# Patient Record
Sex: Female | Born: 1940 | Race: White | Hispanic: No | Smoking: Never smoker
Health system: Southern US, Community
[De-identification: ages and names within clinical notes are randomized; demographics above are authoritative.]

## PROBLEM LIST (undated history)

## (undated) DIAGNOSIS — I441 Atrioventricular block, second degree: Secondary | ICD-10-CM

## (undated) DIAGNOSIS — E785 Hyperlipidemia, unspecified: Secondary | ICD-10-CM

## (undated) DIAGNOSIS — C801 Malignant (primary) neoplasm, unspecified: Secondary | ICD-10-CM

## (undated) DIAGNOSIS — C73 Malignant neoplasm of thyroid gland: Secondary | ICD-10-CM

## (undated) DIAGNOSIS — I10 Essential (primary) hypertension: Secondary | ICD-10-CM

## (undated) DIAGNOSIS — M5134 Other intervertebral disc degeneration, thoracic region: Secondary | ICD-10-CM

## (undated) DIAGNOSIS — N200 Calculus of kidney: Secondary | ICD-10-CM

## (undated) HISTORY — PX: JOINT REPLACEMENT: SHX530

## (undated) HISTORY — PX: CHOLECYSTECTOMY: SHX55

## (undated) HISTORY — PX: ABDOMINAL HYSTERECTOMY: SHX81

## (undated) HISTORY — PX: THYROID SURGERY: SHX805

---

## 2016-01-25 ENCOUNTER — Inpatient Hospital Stay (HOSPITAL_COMMUNITY)
Admission: EM | Admit: 2016-01-25 | Discharge: 2016-01-29 | DRG: 244 | Disposition: A | Discharge status: Home / Self Care | Payer: Medicare PPO | Attending: Cardiovascular Disease | Admitting: Cardiovascular Disease

## 2016-01-25 ENCOUNTER — Encounter (HOSPITAL_COMMUNITY): Payer: Self-pay

## 2016-01-25 DIAGNOSIS — R072 Precordial pain: Secondary | ICD-10-CM | POA: Diagnosis not present

## 2016-01-25 DIAGNOSIS — M549 Dorsalgia, unspecified: Secondary | ICD-10-CM | POA: Diagnosis present

## 2016-01-25 DIAGNOSIS — R079 Chest pain, unspecified: Secondary | ICD-10-CM | POA: Diagnosis not present

## 2016-01-25 DIAGNOSIS — R001 Bradycardia, unspecified: Secondary | ICD-10-CM | POA: Diagnosis not present

## 2016-01-25 DIAGNOSIS — Z79890 Hormone replacement therapy (postmenopausal): Secondary | ICD-10-CM

## 2016-01-25 DIAGNOSIS — I442 Atrioventricular block, complete: Principal | ICD-10-CM | POA: Diagnosis present

## 2016-01-25 DIAGNOSIS — G8929 Other chronic pain: Secondary | ICD-10-CM | POA: Diagnosis present

## 2016-01-25 DIAGNOSIS — E785 Hyperlipidemia, unspecified: Secondary | ICD-10-CM | POA: Diagnosis present

## 2016-01-25 DIAGNOSIS — Z85850 Personal history of malignant neoplasm of thyroid: Secondary | ICD-10-CM

## 2016-01-25 DIAGNOSIS — Z79899 Other long term (current) drug therapy: Secondary | ICD-10-CM | POA: Diagnosis not present

## 2016-01-25 DIAGNOSIS — Z886 Allergy status to analgesic agent status: Secondary | ICD-10-CM | POA: Diagnosis not present

## 2016-01-25 DIAGNOSIS — I10 Essential (primary) hypertension: Secondary | ICD-10-CM | POA: Diagnosis present

## 2016-01-25 DIAGNOSIS — E039 Hypothyroidism, unspecified: Secondary | ICD-10-CM | POA: Diagnosis present

## 2016-01-25 DIAGNOSIS — Z959 Presence of cardiac and vascular implant and graft, unspecified: Secondary | ICD-10-CM

## 2016-01-25 DIAGNOSIS — Z882 Allergy status to sulfonamides status: Secondary | ICD-10-CM | POA: Diagnosis not present

## 2016-01-25 DIAGNOSIS — E890 Postprocedural hypothyroidism: Secondary | ICD-10-CM | POA: Diagnosis present

## 2016-01-25 DIAGNOSIS — M1990 Unspecified osteoarthritis, unspecified site: Secondary | ICD-10-CM | POA: Diagnosis present

## 2016-01-25 DIAGNOSIS — Z880 Allergy status to penicillin: Secondary | ICD-10-CM | POA: Diagnosis not present

## 2016-01-25 DIAGNOSIS — R42 Dizziness and giddiness: Secondary | ICD-10-CM | POA: Diagnosis present

## 2016-01-25 DIAGNOSIS — I441 Atrioventricular block, second degree: Secondary | ICD-10-CM | POA: Diagnosis not present

## 2016-01-25 HISTORY — DX: Hyperlipidemia, unspecified: E78.5

## 2016-01-25 HISTORY — DX: Other intervertebral disc degeneration, thoracic region: M51.34

## 2016-01-25 HISTORY — DX: Essential (primary) hypertension: I10

## 2016-01-25 HISTORY — DX: Malignant neoplasm of thyroid gland: C73

## 2016-01-25 HISTORY — DX: Atrioventricular block, second degree: I44.1

## 2016-01-25 HISTORY — DX: Calculus of kidney: N20.0

## 2016-01-25 LAB — COMPREHENSIVE METABOLIC PANEL
ALBUMIN: 3.8 g/dL (ref 3.5–5.0)
ALT: 16 U/L (ref 14–54)
AST: 24 U/L (ref 15–41)
Alkaline Phosphatase: 41 U/L (ref 38–126)
Anion gap: 12 (ref 5–15)
BUN: 14 mg/dL (ref 6–20)
CHLORIDE: 105 mmol/L (ref 101–111)
CO2: 28 mmol/L (ref 22–32)
CREATININE: 0.96 mg/dL (ref 0.44–1.00)
Calcium: 9.4 mg/dL (ref 8.9–10.3)
GFR calc Af Amer: >60 mL/min (ref >60)
GFR calc non Af Amer: 57 mL/min — ABNORMAL LOW (ref >60)
GLUCOSE: 119 mg/dL — AB (ref 65–99)
POTASSIUM: 4.7 mmol/L (ref 3.5–5.1)
Sodium: 145 mmol/L (ref 135–145)
Total Bilirubin: 0.6 mg/dL (ref 0.3–1.2)
Total Protein: 6.4 g/dL — ABNORMAL LOW (ref 6.5–8.1)

## 2016-01-25 LAB — CBC
HCT: 42.7 % (ref 36.0–46.0)
Hemoglobin: 13.9 g/dL (ref 12.0–15.0)
MCH: 30.2 pg (ref 26.0–34.0)
MCHC: 32.6 g/dL (ref 30.0–36.0)
MCV: 92.8 fL (ref 78.0–100.0)
PLATELETS: 231 10*3/uL (ref 150–400)
RBC: 4.6 MIL/uL (ref 3.87–5.11)
RDW: 14.6 % (ref 11.5–15.5)
WBC: 6.8 10*3/uL (ref 4.0–10.5)

## 2016-01-25 LAB — I-STAT TROPONIN, ED: Troponin i, poc: 0 ng/mL (ref 0.00–0.08)

## 2016-01-25 LAB — TROPONIN I
TROPONIN I: 0.04 ng/mL — AB (ref <0.031)
TROPONIN I: 0.04 ng/mL — AB (ref <0.031)
Troponin I: 0.04 ng/mL — ABNORMAL HIGH (ref <0.031)

## 2016-01-25 LAB — MAGNESIUM
MAGNESIUM: 1.9 mg/dL (ref 1.7–2.4)
Magnesium: 1.8 mg/dL (ref 1.7–2.4)

## 2016-01-25 LAB — PROTIME-INR
INR: 1.07 (ref 0.00–1.49)
Prothrombin Time: 14.1 seconds (ref 11.6–15.2)

## 2016-01-25 LAB — APTT: APTT: 35 s (ref 24–37)

## 2016-01-25 LAB — TSH
TSH: 1.984 u[IU]/mL (ref 0.350–4.500)
TSH: 1.434 u[IU]/mL (ref 0.350–4.500)

## 2016-01-25 MED ORDER — ALBUTEROL SULFATE (2.5 MG/3ML) 0.083% IN NEBU: 2.5000 mg | INHALATION_SOLUTION | Freq: Four times a day (QID) | RESPIRATORY_TRACT | Status: DC | PRN

## 2016-01-25 MED ORDER — SODIUM CHLORIDE 0.9 % IV BOLUS (SEPSIS)
1000.0000 mL | Freq: Once | INTRAVENOUS | Status: AC
  Administered 2016-01-25: 1000 mL via INTRAVENOUS

## 2016-01-25 MED ORDER — ACETAMINOPHEN 325 MG PO TABS
650.0000 mg | ORAL_TABLET | Freq: Four times a day (QID) | ORAL | Status: DC | PRN
  Administered 2016-01-26: 650 mg via ORAL
  Filled 2016-01-25: qty 2

## 2016-01-25 MED ORDER — REGADENOSON 0.4 MG/5ML IV SOLN
0.4000 mg | Freq: Once | INTRAVENOUS | Status: AC
  Administered 2016-01-26: 0.4 mg via INTRAVENOUS
  Filled 2016-01-25: qty 5

## 2016-01-25 MED ORDER — ALBUTEROL SULFATE HFA 108 (90 BASE) MCG/ACT IN AERS: 1.0000 | INHALATION_SPRAY | Freq: Four times a day (QID) | RESPIRATORY_TRACT | Status: DC | PRN

## 2016-01-25 MED ORDER — PRAVASTATIN SODIUM 40 MG PO TABS
80.0000 mg | ORAL_TABLET | Freq: Every day | ORAL | Status: DC
  Administered 2016-01-25 – 2016-01-29 (×4): 80 mg via ORAL
  Filled 2016-01-25 (×4): qty 2

## 2016-01-25 MED ORDER — LEVOTHYROXINE SODIUM 88 MCG PO TABS
88.0000 ug | ORAL_TABLET | Freq: Every day | ORAL | Status: DC
  Administered 2016-01-27 – 2016-01-29 (×3): 88 ug via ORAL
  Filled 2016-01-25 (×3): qty 1

## 2016-01-25 MED ORDER — SODIUM CHLORIDE 0.9 % IV SOLN
INTRAVENOUS | Status: DC
  Administered 2016-01-25: 1000 mL via INTRAVENOUS

## 2016-01-25 MED ORDER — GABAPENTIN 300 MG PO CAPS
300.0000 mg | ORAL_CAPSULE | Freq: Three times a day (TID) | ORAL | Status: DC
  Administered 2016-01-25 – 2016-01-29 (×11): 300 mg via ORAL
  Filled 2016-01-25 (×11): qty 1

## 2016-01-25 MED ORDER — LISINOPRIL 10 MG PO TABS
10.0000 mg | ORAL_TABLET | Freq: Every day | ORAL | Status: DC
  Administered 2016-01-25 – 2016-01-29 (×4): 10 mg via ORAL
  Filled 2016-01-25 (×4): qty 1

## 2016-01-25 MED ORDER — ALLOPURINOL 100 MG PO TABS
100.0000 mg | ORAL_TABLET | Freq: Two times a day (BID) | ORAL | Status: DC
  Administered 2016-01-25 – 2016-01-29 (×7): 100 mg via ORAL
  Filled 2016-01-25 (×7): qty 1

## 2016-01-25 NOTE — ED Notes (Signed)
Attempted to call report

## 2016-01-25 NOTE — Plan of Care (Signed)
Problem: Safety: Goal: Ability to remain free from injury will improve Outcome: Completed/Met Date Met:  01/25/16 Pt educated on safety measures put into place. Pt verbalized understanding. Call bell within reach and bed alarm on.

## 2016-01-25 NOTE — ED Notes (Signed)
MD at bedside. 

## 2016-01-25 NOTE — ED Notes (Signed)
Pt request po fluids . Cardiology pa aware and states no until seen by Cardiology MD.

## 2016-01-25 NOTE — ED Notes (Addendum)
Pt arrives EMS from surgical center. Pt was there for back surgery, had been sedated and intubated and on rolling over became bradicardic at 28. No CPR initiated. Pt not intubated on arrival. Pt c/o pain at top jaw.

## 2016-01-25 NOTE — ED Notes (Signed)
Pt up t bedpan .

## 2016-01-25 NOTE — ED Notes (Signed)
Pt tasking po fluids and tolerating well.

## 2016-01-25 NOTE — ED Provider Notes (Signed)
CSN: [id number]     Arrival date & time 01/25/16  [id number] History   First MD Initiated Contact with Patient 01/25/16 0940     Chief Complaint  Patient presents with  . Bradycardia     (Consider location/radiation/quality/duration/timing/severity/associated sxs/prior Treatment) HPI   75 year old female without any significant past medical history presents to the emergency department from the spinal surgery center. She underwent general anesthesia and when he went to roll the patient to a prone position she had  An event where she became bradycardic to the 20s and her blood pressure dropped to 0000000 systolic. She was given some ephedrine , woke up from anesthesia and sent here for further evaluation. On EMS arrival patient still had a heart rate in the 40s but blood pressure had improved [id number] systolic. Review of history with the patient and she still groggy does not relate any history of the same in the right now is only complaining of jaw pain. Her son is with her and also does not recall any events similar to this. She has had previous surgeries before without any complications. At this time no chest pain, shortness of breath, lightheadedness or dizziness. Feel like her grogginess is likely secondary to being postanesthesia.  Past Medical History  Diagnosis Date  . Kidney stones    Past Surgical History  Procedure Laterality Date  . Joint replacement    . Cholecystectomy    . Abdominal hysterectomy    . Thyroid surgery     Family History  Problem Relation Age of Onset  . Hypertension Mother    Social History  Substance Use Topics  . Smoking status: Never Smoker   . Smokeless tobacco: None  . Alcohol Use: No   OB History    No data available     Review of Systems  Unable to perform ROS: Other      Allergies  Dilaudid; Penicillins; Sulfa antibiotics; and Tizanidine hcl  Home Medications   Prior to Admission medications   Medication Sig Start Date End Date Taking?  Authorizing Provider  albuterol (PROVENTIL HFA;VENTOLIN HFA) 108 (90 Base) MCG/ACT inhaler Inhale 1 puff into the lungs every 6 (six) hours as needed for wheezing or shortness of breath.   Yes Historical Provider, MD  allopurinol (ZYLOPRIM) 100 MG tablet Take 100 mg by mouth 2 (two) times daily.   Yes Historical Provider, MD  ALPRAZolam Duanne Moron) 1 MG tablet Take 1 mg by mouth at bedtime as needed for anxiety.   Yes Historical Provider, MD  gabapentin (NEURONTIN) 300 MG capsule Take 300 mg by mouth 3 (three) times daily.   Yes Historical Provider, MD  HYDROcodone-acetaminophen (NORCO) 7.5-325 MG tablet Take 1 tablet by mouth every 6 (six) hours as needed for moderate pain.   Yes Historical Provider, MD  levothyroxine (SYNTHROID, LEVOTHROID) 88 MCG tablet Take 88 mcg by mouth daily before breakfast.   Yes Historical Provider, MD  lisinopril (PRINIVIL,ZESTRIL) 10 MG tablet Take 10 mg by mouth daily.   Yes Historical Provider, MD  pravastatin (PRAVACHOL) 80 MG tablet Take 80 mg by mouth daily.   Yes Historical Provider, MD   BP 132/66 mmHg  Pulse 46  Temp(Src) 98 F (36.7 C) (Oral)  Resp 17  Ht 5\' 4"  (1.626 m)  Wt 190 lb 1.6 oz (86.229 kg)  BMI 32.61 kg/m2  SpO2 100% Physical Exam  Constitutional: She appears well-developed and well-nourished.  HENT:  Head: Normocephalic and atraumatic.  Neck: Normal range of motion.  Cardiovascular: Normal rate  and regular rhythm.   Pulmonary/Chest: Effort normal and breath sounds normal. No stridor. No respiratory distress. She has no wheezes.  Abdominal: Soft. She exhibits no distension.  Musculoskeletal: Normal range of motion. She exhibits no edema.  Neurological: She is alert.  Skin: Skin is warm and dry.  Nursing note and vitals reviewed.   ED Course  Procedures (including critical care time) Labs Review Labs Reviewed  COMPREHENSIVE METABOLIC PANEL - Abnormal; Notable for the following:    Glucose, Bld 119 (*)    Total Protein 6.4 (*)    GFR  calc non Af Amer 57 (*)    All other components within normal limits  TROPONIN I - Abnormal; Notable for the following:    Troponin I 0.04 (*)    All other components within normal limits  BASIC METABOLIC PANEL - Abnormal; Notable for the following:    Glucose, Bld 114 (*)    Calcium 8.7 (*)    GFR calc non Af Amer 58 (*)    All other components within normal limits  TROPONIN I - Abnormal; Notable for the following:    Troponin I 0.04 (*)    All other components within normal limits  TROPONIN I - Abnormal; Notable for the following:    Troponin I 0.04 (*)    All other components within normal limits  TROPONIN I - Abnormal; Notable for the following:    Troponin I 0.05 (*)    All other components within normal limits  APTT  CBC  PROTIME-INR  MAGNESIUM  TSH  TSH  MAGNESIUM  CBC  I-STAT TROPOININ, ED    Imaging Review No results found. I have personally reviewed and evaluated these images and lab results as part of my medical decision-making.   EKG Interpretation   Date/Time:  Friday January 25 2016 14:01:07 EST Ventricular Rate:  61 PR Interval:  56 QRS Duration: 131 QT Interval:  465 QTC Calculation: 468 R Axis:   -10 Text Interpretation:  Sinus rhythm Supraventricular bigeminy Sinus pause  Short PR interval Right bundle branch block ED PHYSICIAN INTERPRETATION  AVAILABLE IN [facility] Confirmed by TEST, Record ([id number]) on 01/26/2016  8:48:12 AM      MDM   Final diagnoses:  Chest pain    75 year old female with  An episode of hypotension and bradycardia on her general anesthesia. Here with heart rates as low as the 40s but improving mental status and overall asymptomatic.  TSH was normal. Rest of her labs were also normal. Her EKG does appear to have a complete heart block so discussed the case cardiology will admit for further observation. She had a minimally elevated troponin think this is likely secondary to demand ischemia from the hypotension episode  however cardiology will continue to trend these.     Merrily Pew, MD 01/26/16 [id number]

## 2016-01-25 NOTE — H&P (Signed)
Patient ID: Sharon Pineda MRN: [id number], DOB/AGE: [birth year]   Admit date: 01/25/2016   Primary Physician: Majel Homer, [place] Primary Cardiologist: [facility] (Dr. Oval Linsey)  Pt. Profile:  75 y/o female with h/o cardiac murmur, HTN, HLD, hypothyroidism and chronic back pain who presents to ED from outpatient surgical center for severe bradycardia after receiving anesthesia for planned lumbar surgery.  Problem List  Past Medical History  Diagnosis Date  . Kidney stones     Past Surgical History  Procedure Laterality Date  . Joint replacement    . Cholecystectomy    . Abdominal hysterectomy    . Thyroid surgery       Allergies  Allergies  Allergen Reactions  . Dilaudid [Hydromorphone Hcl] Anaphylaxis  . Penicillins Swelling    HPI  75 y/o female presenting to the ED for severe bradycardia, after receiving anesthesia at the outpatient surgical clearance for planned lumbar surgery. Apparently, she was getting prepped for surgery. She was intubated and under anesthesia. She was rolled onto her side on the operating table and braided down to 28 bpm. She did not require CPR.  It is not documented whether or not she required any atropine. The surgery was aborted and she was transferred to [facility]. She is currently in the 60s on telemetry but occasionally dips into the 40s and 50s. She is currently resting and comfortable. BP is stable. There are several EKG strips that look concerning with PAF.   The patient notes a h/o HTN and HLD, treated with lisinopril and pravastatin. Also with h/o thyroid cancer and underwent thyroidectomy in the 1970s resulting in hypothyroidism. She is on chronic hormone replacement with  levothyroxine. She denies any prior cardiac history, other than being told that she has a murmur. She reports that she checks her BP and HR at home regularly, and it is not rare for her HR to run in the 40s. She notes frequent dizziness. Also with h/o near syncope. She reports that  she was seen in an ED in 10/2015 for near syncope, but there is no record of this. She denies CP. She has occasional dyspnea with exertion but no resting dyspnea. She also occasionally feels irregular palpitations. She notes that her TSH has not been checked recently. She is not on any AVN blocking agents. K is WNL at 4.7. Troponin is abnormal at 0.04. Renal function is normal. She currently notes bilateral jaw pain but this may be from recent intubation.      Home Medications  Prior to Admission medications   Medication Sig Start Date End Date Taking? Authorizing Provider  albuterol (PROVENTIL HFA;VENTOLIN HFA) 108 (90 Base) MCG/ACT inhaler Inhale 1 puff into the lungs every 6 (six) hours as needed for wheezing or shortness of breath.   Yes Historical Provider, MD  allopurinol (ZYLOPRIM) 100 MG tablet Take 100 mg by mouth 2 (two) times daily.   Yes Historical Provider, MD  ALPRAZolam Duanne Moron) 1 MG tablet Take 1 mg by mouth at bedtime as needed for anxiety.   Yes Historical Provider, MD  gabapentin (NEURONTIN) 300 MG capsule Take 300 mg by mouth 3 (three) times daily.   Yes Historical Provider, MD  HYDROcodone-acetaminophen (NORCO) 7.5-325 MG tablet Take 1 tablet by mouth every 6 (six) hours as needed for moderate pain.   Yes Historical Provider, MD  levothyroxine (SYNTHROID, LEVOTHROID) 88 MCG tablet Take 88 mcg by mouth daily before breakfast.   Yes Historical Provider, MD  lisinopril (PRINIVIL,ZESTRIL) 10 MG tablet Take 10 mg  by mouth daily.   Yes Historical Provider, MD  pravastatin (PRAVACHOL) 80 MG tablet Take 80 mg by mouth daily.   Yes Historical Provider, MD    Family History  HTN - mother  Social History  Social History   Social History  . Marital Status: Unknown    Spouse Name: N/A  . Number of Children: N/A  . Years of Education: N/A   Occupational History  . Not on file.   Social History Main Topics  . Smoking status: Never Smoker   . Smokeless tobacco: Not on file    . Alcohol Use: No  . Drug Use: No  . Sexual Activity: Not on file   Other Topics Concern  . Not on file   Social History Narrative  . No narrative on file     Review of Systems General:  No chills, fever, night sweats or weight changes.  Cardiovascular:  No chest pain, dyspnea on exertion, edema, orthopnea, palpitations, paroxysmal nocturnal dyspnea. Dermatological: No rash, lesions/masses Respiratory: No cough, dyspnea Urologic: No hematuria, dysuria Abdominal:   No nausea, vomiting, diarrhea, bright red blood per rectum, melena, or hematemesis Neurologic:  No visual changes, wkns, changes in mental status. All other systems reviewed and are otherwise negative except as noted above.  Physical Exam  Blood pressure 147/59, pulse 69, temperature 97.8 F (36.6 C), temperature source Oral, resp. rate 19, height 5\' 4"  (1.626 m), weight 186 lb (84.369 kg), SpO2 100 %.  General: Pleasant, NAD Psych: Normal affect. Neuro: Alert and oriented X 3. Moves all extremities spontaneously. HEENT: Normal  Neck: Supple without bruits or JVD. Lungs:  Resp regular and unlabored, CTA. Heart: RRR no s3, s4, faint 1/6 SM along LUSB Abdomen: Soft, non-tender, non-distended, BS + x 4.  Extremities: No clubbing, cyanosis or edema. DP/PT/Radials 2+ and equal bilaterally.  Labs  Troponin [facility] Test)  Recent Labs  01/25/16 1021  TROPIPOC 0.00    Recent Labs  01/25/16 0950  TROPONINI 0.04*   Lab Results  Component Value Date   WBC 6.8 01/25/2016   HGB 13.9 01/25/2016   HCT 42.7 01/25/2016   MCV 92.8 01/25/2016   PLT 231 01/25/2016    Recent Labs Lab 01/25/16 0950  NA 145  K 4.7  CL 105  CO2 28  BUN 14  CREATININE 0.96  CALCIUM 9.4  PROT 6.4*  BILITOT 0.6  ALKPHOS 41  ALT 16  AST 24  GLUCOSE 119*   No results found for: CHOL, HDL, LDLCALC, TRIG No results found for: DDIMER   Radiology/Studies  No results found.  ECG  Several EKGs obtained. Some show  NSR, sinus brady and ? PAF  ASSESSMENT AND PLAN  1. Bradycardia: patient reports frequent h/o low pulse rates, occasionally in the 40s at home. She became severely bradycardiac after receiving anesthesia. She is now stable in the ED however her heart rate continues to fluctuate occasionally dipping down in the 40s and 50s. She is  currently normal sinus rhythm in the low 60s. K is stable at 4.7. Hemoglobin and renal function are  both normal. Initial troponin is abnormal at 0.04. She does not appear to be on any AV nodal blocking agents. Suspect she has baseline bradycardia that was exacerbated by the anesthesia. However we will admit for overnight observation for continued telemetry monitoring. Given her known hypothyroidism and fact that her TSH has not been recently checked, this will need to be assessed to ensure that she is receiving enough  replacement with levothyroxine. We will also need to continue to cycle cardiac enzymes. Initial troponin is 0.04. Will cycle 3. She reports a history of cardiac murmur which is also present on exam today at the left upper sternal border. Will obtain a 2-D echo to assess for structural abnormalities. We will also order a Lexiscan NST to r/o ischemia ?RCA ischemia. If NST is normal, we will ask EP to see for possible PPM.    Signed, SIMMONS, BRITTAINY, PA-C 01/25/2016, 11:28 AM

## 2016-01-25 NOTE — Progress Notes (Signed)
Pt's resting in bed. Pt denies any CP or SOB. HR sustaining in the 40's and will dip down into the 30's non sustaining. EKG obtained. Dr. Susy Manor made aware and reviewed saved tele strips and EKG. Will cont to monitor pt.

## 2016-01-25 NOTE — ED Notes (Signed)
Pt logrolled and sheets changed with skin care.

## 2016-01-26 ENCOUNTER — Inpatient Hospital Stay (HOSPITAL_COMMUNITY): Payer: Medicare PPO

## 2016-01-26 DIAGNOSIS — R079 Chest pain, unspecified: Secondary | ICD-10-CM

## 2016-01-26 LAB — CBC
HEMATOCRIT: 37.6 % (ref 36.0–46.0)
Hemoglobin: 12 g/dL (ref 12.0–15.0)
MCH: 29.9 pg (ref 26.0–34.0)
MCHC: 31.9 g/dL (ref 30.0–36.0)
MCV: 93.5 fL (ref 78.0–100.0)
Platelets: 202 10*3/uL (ref 150–400)
RBC: 4.02 MIL/uL (ref 3.87–5.11)
RDW: 14.6 % (ref 11.5–15.5)
WBC: 5.5 10*3/uL (ref 4.0–10.5)

## 2016-01-26 LAB — NM MYOCAR MULTI W/SPECT W/WALL MOTION / EF
CSEPEDS: 4 s
Estimated workload: 1 METS
Exercise duration (min): 8 min
Peak HR: 98 {beats}/min
Rest HR: 46 {beats}/min

## 2016-01-26 LAB — TROPONIN I: Troponin I: 0.05 ng/mL — ABNORMAL HIGH (ref <0.031)

## 2016-01-26 LAB — BASIC METABOLIC PANEL
ANION GAP: 7 (ref 5–15)
BUN: 9 mg/dL (ref 6–20)
CALCIUM: 8.7 mg/dL — AB (ref 8.9–10.3)
CO2: 30 mmol/L (ref 22–32)
CREATININE: 0.95 mg/dL (ref 0.44–1.00)
Chloride: 108 mmol/L (ref 101–111)
GFR calc Af Amer: >60 mL/min (ref >60)
GFR calc non Af Amer: 58 mL/min — ABNORMAL LOW (ref >60)
GLUCOSE: 114 mg/dL — AB (ref 65–99)
Potassium: 4.2 mmol/L (ref 3.5–5.1)
Sodium: 145 mmol/L (ref 135–145)

## 2016-01-26 MED ORDER — REGADENOSON 0.4 MG/5ML IV SOLN
0.4000 mg | Freq: Once | INTRAVENOUS | Status: DC
  Filled 2016-01-26: qty 5

## 2016-01-26 MED ORDER — TECHNETIUM TC 99M SESTAMIBI GENERIC - CARDIOLITE
30.0000 | Freq: Once | INTRAVENOUS | Status: AC | PRN
  Administered 2016-01-26: 30 via INTRAVENOUS

## 2016-01-26 MED ORDER — TECHNETIUM TC 99M SESTAMIBI GENERIC - CARDIOLITE
10.0000 | Freq: Once | INTRAVENOUS | Status: AC | PRN
  Administered 2016-01-26: 10 via INTRAVENOUS

## 2016-01-26 MED ORDER — ALPRAZOLAM 0.5 MG PO TABS
1.0000 mg | ORAL_TABLET | Freq: Every evening | ORAL | Status: DC | PRN
  Administered 2016-01-26 – 2016-01-28 (×3): 1 mg via ORAL
  Filled 2016-01-26 (×3): qty 2

## 2016-01-26 MED ORDER — REGADENOSON 0.4 MG/5ML IV SOLN
INTRAVENOUS | Status: AC
  Administered 2016-01-26: 0.4 mg via INTRAVENOUS
  Filled 2016-01-26: qty 5

## 2016-01-26 NOTE — Progress Notes (Signed)
  Echocardiogram 2D Echocardiogram has been performed.  Sharon Pineda 01/26/2016, 3:19 PM

## 2016-01-26 NOTE — Progress Notes (Signed)
   Patient Name: Sharon Pineda Date of Encounter: 01/26/2016   SUBJECTIVE  Seen in nuc med. No CP or sob.   CURRENT MEDS . allopurinol  100 mg Oral BID  . gabapentin  300 mg Oral TID  . levothyroxine  88 mcg Oral QAC breakfast  . lisinopril  10 mg Oral Daily  . pravastatin  80 mg Oral Daily  . regadenoson  0.4 mg Intravenous Once    OBJECTIVE  Filed Vitals:   01/26/16 1003 01/26/16 1004 01/26/16 1005 01/26/16 1006  BP: 142/70 171/50  164/50  Pulse: 96 96 91 74  Temp:      TempSrc:      Resp:      Height:      Weight:      SpO2:        Intake/Output Summary (Last 24 hours) at 01/26/16 1010 Last data filed at 01/26/16 0500  Gross per 24 hour  Intake     60 ml  Output    550 ml  Net   -490 ml   Filed Weights   01/25/16 0950 01/25/16 1722 01/26/16 0500  Weight: 186 lb (84.369 kg) 191 lb 12.8 oz (87 kg) 190 lb 1.6 oz (86.229 kg)    PHYSICAL EXAM  General: Pleasant, NAD. Neuro: Alert and oriented X 3. Moves all extremities spontaneously. Psych: Normal affect. HEENT:  Normal  Neck: Supple without bruits or JVD. Lungs:  Resp regular and unlabored, CTA. Heart: RR with bradycardia no s3, s4, or murmurs. Abdomen: Soft, non-tender, non-distended, BS + x 4.  Extremities: No clubbing, cyanosis or edema. DP/PT/Radials 2+ and equal bilaterally.  Accessory Clinical Findings  CBC  Recent Labs  01/25/16 0950 01/26/16 0427  WBC 6.8 5.5  HGB 13.9 12.0  HCT 42.7 37.6  MCV 92.8 93.5  PLT 231 123XX123   Basic Metabolic Panel  Recent Labs  01/25/16 0950 01/25/16 1757 01/26/16 0427  NA 145  --  145  K 4.7  --  4.2  CL 105  --  108  CO2 28  --  30  GLUCOSE 119*  --  114*  BUN 14  --  9  CREATININE 0.96  --  0.95  CALCIUM 9.4  --  8.7*  MG 1.9 1.8  --    Liver Function Tests  Recent Labs  01/25/16 0950  AST 24  ALT 16  ALKPHOS 41  BILITOT 0.6  PROT 6.4*  ALBUMIN 3.8   No results for input(s): LIPASE, AMYLASE in the last 72 hours. Cardiac  Enzymes  Recent Labs  01/25/16 1757 01/25/16 2307 01/26/16 0427  TROPONINI 0.04* 0.04* 0.05*  Thyroid Function Tests  Recent Labs  01/25/16 1757  TSH 1.434    TELE  Unable to review as patient seen in nuc med.   Radiology/Studies  No results found.  ASSESSMENT AND PLAN Active Problems:   Bradycardia   Hypothyroidism   HTN (hypertension)   HLD (hyperlipidemia)    Plan: No chest pain overnight. Troponin flat trend. TSH normal. During first 5 mins of test she went into 1st degree AV block and about 5 mins seems in junctional rhythm (intermittent P waves). Final report to follow.   Jarrett Soho PA-C Pager [phone]

## 2016-01-27 DIAGNOSIS — E038 Other specified hypothyroidism: Secondary | ICD-10-CM

## 2016-01-27 LAB — BASIC METABOLIC PANEL
ANION GAP: 10 (ref 5–15)
BUN: 10 mg/dL (ref 6–20)
CHLORIDE: 103 mmol/L (ref 101–111)
CO2: 29 mmol/L (ref 22–32)
Calcium: 8.8 mg/dL — ABNORMAL LOW (ref 8.9–10.3)
Creatinine, Ser: 0.93 mg/dL (ref 0.44–1.00)
GFR calc non Af Amer: 59 mL/min — ABNORMAL LOW (ref >60)
Glucose, Bld: 106 mg/dL — ABNORMAL HIGH (ref 65–99)
POTASSIUM: 3.8 mmol/L (ref 3.5–5.1)
Sodium: 142 mmol/L (ref 135–145)

## 2016-01-27 NOTE — Progress Notes (Addendum)
Patient Name: Sharon Pineda Date of Encounter: 01/27/2016   SUBJECTIVE: Feeling well. Denies chest pain, palpitations, or shortness of breath. She does endorse dizziness at times.  CURRENT MEDS . allopurinol  100 mg Oral BID  . gabapentin  300 mg Oral TID  . levothyroxine  88 mcg Oral QAC breakfast  . lisinopril  10 mg Oral Daily  . pravastatin  80 mg Oral Daily  . regadenoson  0.4 mg Intravenous Once    OBJECTIVE  Filed Vitals:   01/26/16 1323 01/26/16 1540 01/26/16 2100 01/27/16 0500  BP: 155/51 152/74 120/40 135/45  Pulse: 64 68 50 122  Temp: 98.2 F (36.8 C) 98.6 F (37 C) 98.1 F (36.7 C) 98 F (36.7 C)  TempSrc: Oral Oral    Resp: 16 18 17 16   Height:      Weight:    84.777 kg (186 lb 14.4 oz)  SpO2: 96% 96% 94% 98%    Intake/Output Summary (Last 24 hours) at 01/27/16 1208 Last data filed at 01/27/16 0900  Gross per 24 hour  Intake    720 ml  Output    750 ml  Net    -30 ml   Filed Weights   01/25/16 1722 01/26/16 0500 01/27/16 0500  Weight: 87 kg (191 lb 12.8 oz) 86.229 kg (190 lb 1.6 oz) 84.777 kg (186 lb 14.4 oz)    PHYSICAL EXAM  Gen: Well-appearing.  Neck: No JVD. COR: Irregularly irregular. II/VI systolic murmur. No r/g. Lungs: CTAB.  Abd: Soft, NT, ND. +BS Ext WWP. No edema   Accessory Clinical Findings  CBC  Recent Labs  01/25/16 0950 01/26/16 0427  WBC 6.8 5.5  HGB 13.9 12.0  HCT 42.7 37.6  MCV 92.8 93.5  PLT 231 123XX123   Basic Metabolic Panel  Recent Labs  01/25/16 0950 01/25/16 1757 01/26/16 0427 01/27/16 0419  NA 145  --  145 142  K 4.7  --  4.2 3.8  CL 105  --  108 103  CO2 28  --  30 29  GLUCOSE 119*  --  114* 106*  BUN 14  --  9 10  CREATININE 0.96  --  0.95 0.93  CALCIUM 9.4  --  8.7* 8.8*  MG 1.9 1.8  --   --    Liver Function Tests  Recent Labs  01/25/16 0950  AST 24  ALT 16  ALKPHOS 41  BILITOT 0.6  PROT 6.4*  ALBUMIN 3.8   No results for input(s): LIPASE, AMYLASE in the last 72  hours. Cardiac Enzymes  Recent Labs  01/25/16 1757 01/25/16 2307 01/26/16 0427  TROPONINI 0.04* 0.04* 0.05*  Thyroid Function Tests  Recent Labs  01/25/16 1757  TSH 1.434    TELE:  Mobitz 2 with heart rates in the 93s. Patient drops every third beat.  Radiology/Studies  Nm Myocar Multi W/spect W/wall Motion / Ef  01/26/2016  CLINICAL DATA:  75 year old female with history of hypertension and bradycardia. EXAM: MYOCARDIAL IMAGING WITH SPECT (REST AND PHARMACOLOGIC-STRESS) GATED LEFT VENTRICULAR WALL MOTION STUDY LEFT VENTRICULAR EJECTION FRACTION TECHNIQUE: Standard myocardial SPECT imaging was performed after resting intravenous injection of 10 mCi Tc-45m sestamibi. Subsequently, intravenous infusion of Lexiscan was performed under the supervision of the Cardiology staff. At peak effect of the drug, 30 mCi Tc-57m sestamibi was injected intravenously and standard myocardial SPECT imaging was performed. Quantitative gated imaging was also performed to evaluate left ventricular wall motion, and estimate left ventricular ejection fraction. COMPARISON:  None. FINDINGS:  Perfusion: Small region of mild Decrease counts within the apical segment of the anterior septal wall fixed on rest and stress. No evidence or reversible ischemia. Wall Motion: Septal hypokinesia.  No LEFT ventricular dilatation Left Ventricular Ejection Fraction: 75 % End diastolic volume 70 ml End systolic volume 17 ml IMPRESSION: 1. No reversible ischemia or infarction. Mild Decreased counts in the anterior septal apical segment likely represent RV insertion or Breast attenuation. 2. Mild septal hypokinesia. 3. Left ventricular ejection fraction 75% 4. Low-risk stress test findings*. *2012 Appropriate Use Criteria for Coronary Revascularization Focused Update: J Am Coll Cardiol. [id number]. http://content.airportbarriers.com.aspx?articleid=1201161 Electronically Signed   By: Suzy Bouchard M.D.   On: 01/26/2016 12:00     ASSESSMENT AND PLAN Active Problems:   Bradycardia   Hypothyroidism   HTN (hypertension)   HLD (hyperlipidemia)   Pain in the chest   # Bradycardia, Mobitz II: Sharon Pineda has symptomatic bradycardia. She was witnessed to drop into the 20s with anesthesia. She also reports episodes of near syncope at home. Her rates here have been into the low 40s while awake. While she is not always symptomatic when this occurs, I think it is clear that she has been having some symptomatic bradycardia at home. Therefore, it is reasonable to proceed with pacemaker implantation. Her chest test was negative for ischemia and her echo does not reveal any significant abnormalities. We will make her nothing by mouth after midnight and ask electrophysiology to see her in the morning. Hopefully she can have a pacemaker implanted later in the day.  She has not on any blood thinners.  This plan was discussed with Dr. Curt Bears, who will make sure she is seen tomorrow.  # Hypertension: Blood pressure well-controlled this a.m. It was elevated during the day yesterday. We will be liberal with her blood pressure control while she is having bradycardia. Continue lisinopril.   # Hyperlipidemia: Continue pravastatin.   Signed,  Satoshi Kalas C. Oval Linsey, MD, [facility]  01/27/2016  12:12 PM

## 2016-01-28 ENCOUNTER — Encounter (HOSPITAL_COMMUNITY): Payer: Self-pay | Admitting: Nurse Practitioner

## 2016-01-28 ENCOUNTER — Encounter (HOSPITAL_COMMUNITY)
Admission: EM | Disposition: A | Discharge status: Home / Self Care | Payer: Medicare PPO | Source: Home / Self Care | Attending: Cardiovascular Disease

## 2016-01-28 DIAGNOSIS — R42 Dizziness and giddiness: Secondary | ICD-10-CM

## 2016-01-28 DIAGNOSIS — I441 Atrioventricular block, second degree: Secondary | ICD-10-CM

## 2016-01-28 HISTORY — PX: EP IMPLANTABLE DEVICE: SHX172B

## 2016-01-28 LAB — BASIC METABOLIC PANEL
ANION GAP: 8 (ref 5–15)
BUN: 11 mg/dL (ref 6–20)
CO2: 29 mmol/L (ref 22–32)
Calcium: 8.7 mg/dL — ABNORMAL LOW (ref 8.9–10.3)
Chloride: 105 mmol/L (ref 101–111)
Creatinine, Ser: 0.88 mg/dL (ref 0.44–1.00)
Glucose, Bld: 115 mg/dL — ABNORMAL HIGH (ref 65–99)
POTASSIUM: 3.7 mmol/L (ref 3.5–5.1)
SODIUM: 142 mmol/L (ref 135–145)

## 2016-01-28 SURGERY — PACEMAKER IMPLANT

## 2016-01-28 MED ORDER — FENTANYL CITRATE (PF) 100 MCG/2ML IJ SOLN
INTRAMUSCULAR | Status: AC
  Filled 2016-01-28: qty 2

## 2016-01-28 MED ORDER — SODIUM CHLORIDE 0.9 % IV SOLN
INTRAVENOUS | Status: AC
  Administered 2016-01-28: 18:00:00 via INTRAVENOUS

## 2016-01-28 MED ORDER — HEPARIN SODIUM (PORCINE) 1000 UNIT/ML IJ SOLN
INTRAMUSCULAR | Status: AC
  Filled 2016-01-28: qty 1

## 2016-01-28 MED ORDER — SODIUM CHLORIDE 0.9 % IV SOLN
INTRAVENOUS | Status: DC
  Administered 2016-01-28: 16:00:00 via INTRAVENOUS

## 2016-01-28 MED ORDER — VANCOMYCIN HCL IN DEXTROSE 1-5 GM/200ML-% IV SOLN
1000.0000 mg | INTRAVENOUS | Status: AC
  Administered 2016-01-28: 1000 mg via INTRAVENOUS
  Filled 2016-01-28: qty 200

## 2016-01-28 MED ORDER — ONDANSETRON HCL 4 MG/2ML IJ SOLN: 4.0000 mg | Freq: Four times a day (QID) | INTRAMUSCULAR | Status: DC | PRN

## 2016-01-28 MED ORDER — MIDAZOLAM HCL 5 MG/5ML IJ SOLN
INTRAMUSCULAR | Status: AC
  Filled 2016-01-28: qty 5

## 2016-01-28 MED ORDER — MIDAZOLAM HCL 5 MG/5ML IJ SOLN
INTRAMUSCULAR | Status: DC | PRN
  Administered 2016-01-28 (×3): 1 mg via INTRAVENOUS

## 2016-01-28 MED ORDER — VANCOMYCIN HCL IN DEXTROSE 1-5 GM/200ML-% IV SOLN: 1000.0000 mg | INTRAVENOUS | Status: DC

## 2016-01-28 MED ORDER — VANCOMYCIN HCL IN DEXTROSE 1-5 GM/200ML-% IV SOLN
1000.0000 mg | Freq: Two times a day (BID) | INTRAVENOUS | Status: AC
  Administered 2016-01-29: 1000 mg via INTRAVENOUS
  Filled 2016-01-28: qty 200

## 2016-01-28 MED ORDER — CHLORHEXIDINE GLUCONATE 4 % EX LIQD
60.0000 mL | Freq: Once | CUTANEOUS | Status: AC
  Administered 2016-01-28: 4 via TOPICAL
  Filled 2016-01-28: qty 60

## 2016-01-28 MED ORDER — CHLORHEXIDINE GLUCONATE 4 % EX LIQD
60.0000 mL | Freq: Once | CUTANEOUS | Status: AC
  Filled 2016-01-28: qty 60

## 2016-01-28 MED ORDER — LIDOCAINE HCL (PF) 1 % IJ SOLN
INTRAMUSCULAR | Status: DC | PRN
  Administered 2016-01-28: 40 mL

## 2016-01-28 MED ORDER — FENTANYL CITRATE (PF) 100 MCG/2ML IJ SOLN
INTRAMUSCULAR | Status: DC | PRN
  Administered 2016-01-28 (×3): 25 ug via INTRAVENOUS

## 2016-01-28 MED ORDER — SODIUM CHLORIDE 0.9 % IR SOLN
80.0000 mg | Status: AC
  Administered 2016-01-28: 80 mg

## 2016-01-28 MED ORDER — ACETAMINOPHEN 325 MG PO TABS: 325.0000 mg | ORAL_TABLET | ORAL | Status: DC | PRN

## 2016-01-28 MED ORDER — LIDOCAINE HCL (PF) 1 % IJ SOLN
INTRAMUSCULAR | Status: AC
  Filled 2016-01-28: qty 60

## 2016-01-28 MED ORDER — SODIUM CHLORIDE 0.9 % IR SOLN
Status: AC
  Filled 2016-01-28: qty 2

## 2016-01-28 SURGICAL SUPPLY — 7 items
CABLE SURGICAL S-101-97-12 (CABLE) ×3 IMPLANT
LEAD CAPSURE NOVUS 45CM (Lead) ×3 IMPLANT
LEAD CAPSURE NOVUS 5076-52CM (Lead) ×3 IMPLANT
PAD DEFIB LIFELINK (PAD) ×3 IMPLANT
PPM ADVISA MRI DR A2DR01 (Pacemaker) ×3 IMPLANT
SHEATH CLASSIC 7F (SHEATH) ×6 IMPLANT
TRAY PACEMAKER INSERTION (PACKS) ×3 IMPLANT

## 2016-01-28 NOTE — Discharge Summary (Signed)
ELECTROPHYSIOLOGY PROCEDURE DISCHARGE SUMMARY    Patient ID: Sharon Pineda,  MRN: [id number], DOB/AGE: 75-Feb-1942 75 y.o.  Admit date: 01/25/2016 Discharge date: 01/29/2016  Primary Care Physician: Majel Homer, [place] Primary Cardiologist: Oval Linsey Electrophysiologist: Caryl Comes - will follow with Ricco Dershem in [facility]  Primary Discharge Diagnosis:  Symptomatic bradycardia status post pacemaker implantation this admission  Secondary Discharge Diagnosis:  1.  DJD 2.  HTN 3.  Hyperlipidemia  Allergies  Allergen Reactions  . Dilaudid [Hydromorphone Hcl] Anaphylaxis  . Penicillins Swelling      . Sulfa Antibiotics Swelling  . Tizanidine Hcl Anxiety     Procedures This Admission:  1.  Implantation of a MDT dual chamber PPM on 01/28/16 by Dr Caryl Comes.  The patient received a MDT model number Advisa PPM with model number 5076 right atrial lead and 5076 right ventricular lead. There were no immediate post procedure complications. 2.  CXR on 01/29/16 demonstrated no pneumothorax status post device implantation.  3.  Echo on 01/26/16 demonstrated EF 55-60%, no RWMA, mild MR, LA mildly to moderately dilated 4.  Myoview 01/26/16 with normal EF and no ischemia or prior infarct  Brief HPI/Hospital Course:  Sharon Pineda is a 75 y.o. female with a past medical history as outlined above. She presented for outpatient back surgery and was found to be bradycardic with induction of anesthesia. She was transferred to [facility] for further evaluation. 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  She underwent echo and myoview scanning which were normal. There were no reversible causes identified for her bradycardia.  She has been having intermittent dizzy spells at home that last seconds.  The patient has had symptomatic bradycardia without reversible causes identified.  Risks, benefits, and alternatives to PPM implantation were reviewed with the patient who wished to proceed. The  patient underwent implantation of a MDT dual chamber pacemaker with details as outlined above.  She  was monitored on telemetry overnight which demonstrated sinus rhythm with ventricular pacing.  Left chest was without hematoma or ecchymosis.  The device was interrogated and found to be functioning normally.  CXR was obtained and demonstrated no pneumothorax status post device implantation.  Wound care, arm mobility, and restrictions were reviewed with the patient.  The patient was examined and considered stable for discharge to home.    Physical Exam: Filed Vitals:   01/28/16 1826 01/28/16 1855 01/28/16 2126 01/29/16 0539  BP: 155/80 132/74 124/65 149/71  Pulse:   60 64  Temp:   98 F (36.7 C) 98.4 F (36.9 C)  TempSrc:   Oral Oral  Resp:   16 18  Height:      Weight:    185 lb 11.2 oz (84.233 kg)  SpO2:   97% 97%    GEN- The patient is elderly appearing, alert and oriented x 3 today.   HEENT: normocephalic, atraumatic; sclera clear, conjunctiva pink; hearing intact; oropharynx clear; neck supple  Lungs- Clear to ausculation bilaterally, normal work of breathing.  No wheezes, rales, rhonchi Heart- Regular rate and rhythm (paced) GI- soft, non-tender, non-distended, bowel sounds present  Extremities- no clubbing, cyanosis, or edema; DP/PT/radial pulses 2+ bilaterally MS- no significant deformity or atrophy Skin- warm and dry, no rash or lesion, left chest without hematoma/ecchymosis Psych- euthymic mood, full affect Neuro- strength and sensation are intact   Labs:   Lab Results  Component Value Date   WBC 5.5 01/26/2016   HGB 12.0 01/26/2016   HCT 37.6 01/26/2016  MCV 93.5 01/26/2016   PLT 202 01/26/2016    Recent Labs Lab 01/25/16 0950  01/28/16 0607  NA 145  < > 142  K 4.7  < > 3.7  CL 105  < > 105  CO2 28  < > 29  BUN 14  < > 11  CREATININE 0.96  < > 0.88  CALCIUM 9.4  < > 8.7*  PROT 6.4*  --   --   BILITOT 0.6  --   --   ALKPHOS 41  --   --   ALT 16  --   --    AST 24  --   --   GLUCOSE 119*  < > 115*  < > = values in this interval not displayed.  Discharge Medications:    Medication List    TAKE these medications        albuterol 108 (90 Base) MCG/ACT inhaler  Commonly known as:  PROVENTIL HFA;VENTOLIN HFA  Inhale 1 puff into the lungs every 6 (six) hours as needed for wheezing or shortness of breath.     allopurinol 100 MG tablet  Commonly known as:  ZYLOPRIM  Take 100 mg by mouth 2 (two) times daily.     ALPRAZolam 1 MG tablet  Commonly known as:  XANAX  Take 1 mg by mouth at bedtime as needed for anxiety.     gabapentin 300 MG capsule  Commonly known as:  NEURONTIN  Take 300 mg by mouth 3 (three) times daily.     HYDROcodone-acetaminophen 7.5-325 MG tablet  Commonly known as:  NORCO  Take 1 tablet by mouth every 6 (six) hours as needed for moderate pain.     levothyroxine 88 MCG tablet  Commonly known as:  SYNTHROID, LEVOTHROID  Take 88 mcg by mouth daily before breakfast.     lisinopril 10 MG tablet  Commonly known as:  PRINIVIL,ZESTRIL  Take 10 mg by mouth daily.     pravastatin 80 MG tablet  Commonly known as:  PRAVACHOL  Take 80 mg by mouth daily.        Disposition:  Discharge Instructions    Diet - low sodium heart healthy    Complete by:  As directed      Increase activity slowly    Complete by:  As directed           Follow-up Information    Follow up with [facility] On 02/07/2016.   Specialty:  Cardiology   Why:  at [facility] for wound check    Contact information:   [address] [place] [phone]      Follow up with Thompson Grayer, MD On 05/28/2016.   Specialty:  Cardiology   Why:  at 11:15AM    Contact information:   [place] [address] [phone]       Duration of Discharge Encounter: Greater than 30 minutes including physician time.  Signed, Chanetta Marshall, NP 01/29/2016 9:22 AM   I have seen,  examined the patient, and reviewed the above assessment and plan.  Device interrogation is reviewed and normal.  CXR reveals stable leads, no ptx.  Changes to above are made where necessary.   Lives in [place] and will follow-up with me in the [facility] in 3 months.  Co Sign: Thompson Grayer, MD 01/29/2016 10:59 AM

## 2016-01-28 NOTE — Discharge Instructions (Signed)
° ° °  Supplemental Discharge Instructions for  Pacemaker/Defibrillator Patients  Activity No heavy lifting or vigorous activity with your left/right arm for 6 to 8 weeks.  Do not raise your left/right arm above your head for one week.  Gradually raise your affected arm as drawn below.           __       02/01/16                    02/02/16                   02/03/16                      02/04/16  NO DRIVING for  1 week   ; you may begin driving on  [id number]   .  WOUND CARE - Keep the wound area clean and dry.    - No bandage is needed on the site.  DO  NOT apply any creams, oils, or ointments to the wound area. - If you notice any drainage or discharge from the wound, any swelling or bruising at the site, or you develop a fever > 101? F after you are discharged home, call the office at once.  Special Instructions - You are still able to use cellular telephones; use the ear opposite the side where you have your pacemaker/defibrillator.  Avoid carrying your cellular phone near your device. - When traveling through airports, show security personnel your identification card to avoid being screened in the metal detectors.  Ask the security personnel to use the hand wand. - Avoid arc welding equipment, TENS units (transcutaneous nerve stimulators).  Call the office for questions about other devices. - Avoid electrical appliances that are in poor condition or are not properly grounded. - Microwave ovens are safe to be near or to operate.  Additional information for defibrillator patients should your device go off: - If your device goes off ONCE and you feel fine afterward, notify the device clinic nurses. - If your device goes off ONCE and you do not feel well afterward, call 911. - If your device goes off TWICE, call 911. - If your device goes off THREE times in one day, call 911.  DO NOT DRIVE YOURSELF OR A FAMILY MEMBER WITH A DEFIBRILLATOR TO THE HOSPITAL--CALL 911.

## 2016-01-28 NOTE — Consult Note (Signed)
ELECTROPHYSIOLOGY CONSULT NOTE    Patient ID: Sharon Pineda MRN: [id number], DOB/AGE: 12-22-1941 75 y.o.  Admit date: 01/25/2016 Date of Consult: 01/28/2016  Primary Physician: Majel Homer, [place] Primary Cardiologist: Oval Linsey (new this admission) Requesting Physician: Oval Linsey  Reason for Consultation: bradycardia  HPI:  Sharon Pineda is a 75 y.o. female with a past medical history significant for hypertension, hyperlipidemia, and degenerative disk disease.  She presented to the outpatient surgical center on the day of admission for planned back surgery. With induction of anesthesia, she became bradycardic.  Her surgery was cancelled and she was transferred to [facility] for further evaluation.   Echo 01/26/16 demonstrated EF 55-60%, no RWMA, LA moderately dilated. Myoview this admission with normal EF and no ischemia. Lab work did not demonstrate reversible cause for bradycardia. 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   She reports that at home she is limited functionally by back pain. She does have intermittent dizzy spells that occur both with standing and seating.  They come on and leave suddenly. She has not had frank syncope. She denies chest pain, shortness of breath, recent fevers, chills, nausea or vomiting.   Past Medical History  Diagnosis Date  . Kidney stones   . Hypertension   . Hyperlipidemia   . DDD (degenerative disc disease), thoracic   . Mobitz II      Surgical History:  Past Surgical History  Procedure Laterality Date  . Joint replacement    . Cholecystectomy    . Abdominal hysterectomy    . Thyroid surgery       Prescriptions prior to admission  Medication Sig Dispense Refill Last Dose  . albuterol (PROVENTIL HFA;VENTOLIN HFA) 108 (90 Base) MCG/ACT inhaler Inhale 1 puff into the lungs every 6 (six) hours as needed for wheezing or shortness of breath.   unknown at unknown  . allopurinol (ZYLOPRIM) 100 MG tablet  Take 100 mg by mouth 2 (two) times daily.   01/24/2016 at Unknown time  . ALPRAZolam (XANAX) 1 MG tablet Take 1 mg by mouth at bedtime as needed for anxiety.   01/24/2016 at Unknown time  . gabapentin (NEURONTIN) 300 MG capsule Take 300 mg by mouth 3 (three) times daily.   01/24/2016 at Unknown time  . HYDROcodone-acetaminophen (NORCO) 7.5-325 MG tablet Take 1 tablet by mouth every 6 (six) hours as needed for moderate pain.   01/24/2016 at Unknown time  . levothyroxine (SYNTHROID, LEVOTHROID) 88 MCG tablet Take 88 mcg by mouth daily before breakfast.   01/24/2016 at Unknown time  . lisinopril (PRINIVIL,ZESTRIL) 10 MG tablet Take 10 mg by mouth daily.   01/24/2016 at Unknown time  . pravastatin (PRAVACHOL) 80 MG tablet Take 80 mg by mouth daily.   01/24/2016 at Unknown time    Inpatient Medications:  . allopurinol  100 mg Oral BID  . gabapentin  300 mg Oral TID  . levothyroxine  88 mcg Oral QAC breakfast  . lisinopril  10 mg Oral Daily  . pravastatin  80 mg Oral Daily  . regadenoson  0.4 mg Intravenous Once    Allergies:  Allergies  Allergen Reactions  . Dilaudid [Hydromorphone Hcl] Anaphylaxis  . Penicillins Swelling      . Sulfa Antibiotics Swelling  . Tizanidine Hcl Anxiety    Social History   Social History  . Marital Status: Unknown    Spouse Name: N/A  . Number of Children: N/A  . Years of Education: N/A   Occupational  History  . Not on file.   Social History Main Topics  . Smoking status: Never Smoker   . Smokeless tobacco: Not on file  . Alcohol Use: No  . Drug Use: No  . Sexual Activity: Not on file   Other Topics Concern  . Not on file   Social History Narrative     Family History  Problem Relation Age of Onset  . Hypertension Mother      Review of Systems: All other systems reviewed and are otherwise negative except as noted above.  Physical Exam: Filed Vitals:   01/27/16 1329 01/27/16 1513 01/27/16 2100 01/28/16 0446  BP: 162/75 164/77 137/49 139/71    Pulse: 74  114 51  Temp: 98.3 F (36.8 C) 98.8 F (37.1 C) 98.2 F (36.8 C) 97.9 F (36.6 C)  TempSrc: Oral Oral  Oral  Resp: 16 17 17 18   Height:      Weight:    186 lb 8 oz (84.596 kg)  SpO2: 97% 94% 97% 96%    GEN- The patient is elderly appearing, alert and oriented x 3 today.   HEENT: normocephalic, atraumatic; sclera clear, conjunctiva pink; hearing intact; oropharynx clear; neck supple Lungs- Clear to ausculation bilaterally, normal work of breathing.  No wheezes, rales, rhonchi Heart- Regular rate and rhythm, 2/6 SEM GI- soft, non-tender, non-distended, bowel sounds present Extremities- no clubbing, cyanosis, or edema; DP/PT/radial pulses 2+ bilaterally MS- no significant deformity or atrophy Skin- warm and dry, no rash or lesion Psych- euthymic mood, full affect Neuro- strength and sensation are intact  Labs:   Lab Results  Component Value Date   WBC 5.5 01/26/2016   HGB 12.0 01/26/2016   HCT 37.6 01/26/2016   MCV 93.5 01/26/2016   PLT 202 01/26/2016    Recent Labs Lab 01/25/16 0950  01/28/16 0607  NA 145  < > 142  K 4.7  < > 3.7  CL 105  < > 105  CO2 28  < > 29  BUN 14  < > 11  CREATININE 0.96  < > 0.88  CALCIUM 9.4  < > 8.7*  PROT 6.4*  --   --   BILITOT 0.6  --   --   ALKPHOS 41  --   --   ALT 16  --   --   AST 24  --   --   GLUCOSE 119*  < > 115*  < > = values in this interval not displayed.    Radiology/Studies: Nm Myocar Multi W/spect W/wall Motion / Ef 01/26/2016  CLINICAL DATA:  75 year old female with history of hypertension and bradycardia. EXAM: MYOCARDIAL IMAGING WITH SPECT (REST AND PHARMACOLOGIC-STRESS) GATED LEFT VENTRICULAR WALL MOTION STUDY LEFT VENTRICULAR EJECTION FRACTION TECHNIQUE: Standard myocardial SPECT imaging was performed after resting intravenous injection of 10 mCi Tc-67m sestamibi. Subsequently, intravenous infusion of Lexiscan was performed under the supervision of the Cardiology staff. At peak effect of the drug, 30 mCi  Tc-30m sestamibi was injected intravenously and standard myocardial SPECT imaging was performed. Quantitative gated imaging was also performed to evaluate left ventricular wall motion, and estimate left ventricular ejection fraction. COMPARISON:  None. FINDINGS: Perfusion: Small region of mild Decrease counts within the apical segment of the anterior septal wall fixed on rest and stress. No evidence or reversible ischemia. Wall Motion: Septal hypokinesia.  No LEFT ventricular dilatation Left Ventricular Ejection Fraction: 75 % End diastolic volume 70 ml End systolic volume 17 ml IMPRESSION: 1. No reversible ischemia or infarction.  Mild Decreased counts in the anterior septal apical segment likely represent RV insertion or Breast attenuation. 2. Mild septal hypokinesia. 3. Left ventricular ejection fraction 75% 4. Low-risk stress test findings*. *2012 Appropriate Use Criteria for Coronary Revascularization Focused Update: J Am Coll Cardiol. [id number]. http://content.airportbarriers.com.aspx?articleid=[email or web address] Electronically Signed   By: Suzy Bouchard M.D.   On: 01/26/2016 12:00    EKG:2:1 AV block, ventricular rate 44, RBBB  TELEMETRY: SR with 1st degree AV block, Mobitz I, 2:1 heart block   Assessment/Plan: 1.  2:1 AV block The patient has 2:1 AV block with history of dizziness.  She has not had frank syncope. 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 There are no reversible causes for her bradycardia. Risks, benefits to pacemaker implantation were reviewed with the patient today who would like to proceed. Will schedule at the next available time. With need for MRI, will plan for a Medtronic MRI compatible device.  2.  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 Not clear  correlation but she also has 1AVB and RBBB with syncope We have discussed the role of ILR v PM, and she has opted to proceed with pacing for this 2A indication,     The benefits and risks were reviewed with her and her son  including but not limited to death,  perforation, infection, lead dislodgement and device malfunction.  The patient understands agrees and is willing to proceed.

## 2016-01-29 ENCOUNTER — Encounter (HOSPITAL_COMMUNITY): Payer: Self-pay | Admitting: Internal Medicine

## 2016-01-29 ENCOUNTER — Inpatient Hospital Stay (HOSPITAL_COMMUNITY): Payer: Medicare PPO

## 2016-01-29 DIAGNOSIS — I441 Atrioventricular block, second degree: Secondary | ICD-10-CM

## 2016-01-29 MED FILL — Heparin Sodium (Porcine) 2 Unit/ML in Sodium Chloride 0.9%: INTRAMUSCULAR | Qty: 500 | Status: CN

## 2016-01-29 MED FILL — Heparin Sodium (Porcine) Inj 1000 Unit/ML: INTRAMUSCULAR | Qty: 10 | Status: AC

## 2016-01-29 NOTE — Plan of Care (Signed)
Problem: Phase III Progression Outcomes Goal: Limited arm movement per orders Outcome: Completed/Met Date Met:  01/29/16 Patient has kept her arm in her sling and slightly elevated on a soft blanket, she has rolled from her back to her right side and not on her left side as instructed

## 2016-01-29 NOTE — Plan of Care (Signed)
Problem: Phase II Progression Outcomes Goal: Hemodynamically stable Outcome: Completed/Met Date Met:  01/29/16 VSS throughout the night and patient slept quietly with no complaints and no s/s of complications

## 2016-01-29 NOTE — Progress Notes (Signed)
Post-pacemaker discharge teaching and instructions reviewed. No further questions. VSS. Pt discharging home via son.

## 2016-01-29 NOTE — Plan of Care (Signed)
Problem: Phase II Progression Outcomes Goal: No evidence of pacemaker malfunction Outcome: Progressing Patient has been v-paced for the most part with no problems, will continue to monitor

## 2016-01-29 NOTE — Care Management Important Message (Signed)
Important Message  Patient Details  Name: Sharon Pineda MRN: [id number] Date of Birth: 1941-09-24   Medicare Important Message Given:  Yes    Louanne Belton 01/29/2016, 10:47 AMImportant Message  Patient Details  Name: Sharon Pineda MRN: [id number] Date of Birth: March 17, 1941   Medicare Important Message Given:  Yes    Aiya Keach G 01/29/2016, 10:47 AM

## 2016-01-29 NOTE — Plan of Care (Signed)
Problem: Education: Goal: Knowledge of [facility] Education information/materials will improve Outcome: Completed/Met Date Met:  01/29/16 Reviewed with patient safety and policies for [facility] hospital and for this unit, patient verbalized understanding and is using call light appropriately.

## 2016-01-29 NOTE — Progress Notes (Signed)
Report received via [place] RN in patient's room using [employer], reviewed orders, labs, VS, meds, tests and patient's general condition, assumed care of patient, assessment done while in room, site Clean, dry and intact with a little bruising noted, reviewed how to keep it clean and s/s of infection to call MD or nurse and patient verbalized understanding, reviewed POC that if she is stable tonight she will go home with F/U appointment with her Cardiologist.

## 2016-01-29 NOTE — Plan of Care (Signed)
Problem: Phase III Progression Outcomes Goal: Tolerating diet Outcome: Completed/Met Date Met:  01/29/16 No complaints of N/V and tolerated her dinner without any problems

## 2016-01-29 NOTE — Plan of Care (Signed)
Problem: Phase II Progression Outcomes Goal: Pacer site without bleeding/hematoma Outcome: Completed/Met Date Met:  01/29/16 Site remains clean, dry and intact, slightly ecchymotic but soft with no s/s of complications

## 2016-01-29 NOTE — Progress Notes (Signed)
Patient to CXR via W/C in stable condition.

## 2016-01-29 NOTE — Plan of Care (Signed)
Problem: Phase III Progression Outcomes Goal: Ambulating in room or hall Outcome: Progressing Ambulating in her room with a steady gait and no complications noted, will continue to monitor

## 2016-01-29 NOTE — Plan of Care (Signed)
Problem: Phase III Progression Outcomes Goal: Pain controlled on oral analgesia Outcome: Completed/Met Date Met:  01/29/16 Patient denies pain at this time and is aware to call if she has any pain and she verbalized understanding.

## 2016-02-07 ENCOUNTER — Ambulatory Visit (INDEPENDENT_AMBULATORY_CARE_PROVIDER_SITE_OTHER): Payer: Medicare PPO | Admitting: *Deleted

## 2016-02-07 DIAGNOSIS — Z950 Presence of cardiac pacemaker: Secondary | ICD-10-CM | POA: Diagnosis not present

## 2016-02-07 LAB — CUP PACEART INCLINIC DEVICE CHECK
Brady Statistic AP VS Percent: 0.11 %
Brady Statistic AS VP Percent: 73.52 %
Implantable Lead Implant Date: 20170206
Implantable Lead Location: 753860
Implantable Lead Model: 5076
Lead Channel Impedance Value: 532 Ohm
Lead Channel Pacing Threshold Amplitude: 0.5 V
Lead Channel Pacing Threshold Pulse Width: 0.4 ms
Lead Channel Sensing Intrinsic Amplitude: 12.25 mV
Lead Channel Setting Pacing Amplitude: 3.5 V
Lead Channel Setting Sensing Sensitivity: 2.8 mV
MDC IDC LEAD IMPLANT DT: 20170206
MDC IDC LEAD LOCATION: 753859
MDC IDC MSMT BATTERY VOLTAGE: 3.08 V
MDC IDC MSMT LEADCHNL RA IMPEDANCE VALUE: 437 Ohm
MDC IDC MSMT LEADCHNL RA IMPEDANCE VALUE: 551 Ohm
MDC IDC MSMT LEADCHNL RA SENSING INTR AMPL: 3.625 mV
MDC IDC MSMT LEADCHNL RV IMPEDANCE VALUE: 608 Ohm
MDC IDC SESS DTM: 20170216163749
MDC IDC SET LEADCHNL RV PACING AMPLITUDE: 3.5 V
MDC IDC SET LEADCHNL RV PACING PULSEWIDTH: 0.4 ms
MDC IDC STAT BRADY AP VP PERCENT: 16.67 %
MDC IDC STAT BRADY AS VS PERCENT: 9.7 %
MDC IDC STAT BRADY RA PERCENT PACED: 16.78 %
MDC IDC STAT BRADY RV PERCENT PACED: 90.2 %

## 2016-02-07 NOTE — Progress Notes (Signed)
Wound check appointment. Dermabond in place- edges pulled up in order for the dermabond to be removed more easily during bathing. Wound without redness or edema. Incision edges approximated. Normal device function. Thresholds, sensing, and impedances consistent with implant measurements. Device programmed at 3.5V for extra safety margin until 3 month visit. Histogram distribution appropriate for patient and level of activity. No mode switches or high ventricular rates noted. Patient educated about wound care, arm mobility, lifting restrictions. ROV with JA/E in June.

## 2016-02-08 ENCOUNTER — Encounter: Payer: Self-pay | Admitting: Internal Medicine

## 2016-03-20 ENCOUNTER — Encounter: Payer: Self-pay | Admitting: Cardiology

## 2016-03-20 ENCOUNTER — Ambulatory Visit (INDEPENDENT_AMBULATORY_CARE_PROVIDER_SITE_OTHER): Payer: Medicare PPO | Admitting: Cardiology

## 2016-03-20 VITALS — BP 128/80 | HR 72 | Ht 64.0 in | Wt 180.6 lb

## 2016-03-20 DIAGNOSIS — I10 Essential (primary) hypertension: Secondary | ICD-10-CM | POA: Diagnosis not present

## 2016-03-20 DIAGNOSIS — Z01810 Encounter for preprocedural cardiovascular examination: Secondary | ICD-10-CM

## 2016-03-20 DIAGNOSIS — R001 Bradycardia, unspecified: Secondary | ICD-10-CM

## 2016-03-20 NOTE — Patient Instructions (Signed)
Your physician wants you to follow-up in: [place] DR. BRANCH You will receive a reminder letter in the mail two months in advance. If you don't receive a letter, please call our office to schedule the follow-up appointment.  Your physician recommends that you continue on your current medications as directed. Please refer to the Current Medication list given to you today.  WE WILL SCHEDULE A DEVICE CHECK   Thank you for choosing [facility]!!

## 2016-03-20 NOTE — Progress Notes (Signed)
Patient ID: Sharon Pineda, female   DOB: [birth year], 75 y.o.   MRN: [id number]     Clinical Summary Sharon Pineda is a 75 y.o.female seen today for follow up, this is our first visit together. Sharon Pineda is seen for the following medical problems.  1. Bradycardia - recent implant of dual chamber pacemaker 01/28/16 by Dr Caryl Comes - can have somelightheadness with standing.  - increased fatigue over the last few weeks. Sharon Pineda reports for the first 3 weeks after pacemaker placement that has gradually declined.    2. Preoperative evaluation - Sharon Pineda is being considered for back surgery   Past Medical History  Diagnosis Date  . Kidney stones   . Hypertension   . Hyperlipidemia   . DDD (degenerative disc disease), thoracic   . Mobitz II   . Thyroid cancer ([place])     a. s/p thyroidecetomy     Allergies  Allergen Reactions  . Dilaudid [Hydromorphone Hcl] Anaphylaxis  . Penicillins Swelling    Has patient had a PCN reaction causing immediate rash, facial/tongue/throat swelling, SOB or lightheadedness with hypotension:  Has patient had a PCN reaction causing severe rash involving mucus membranes or skin necrosis: No Has patient had a PCN reaction that required hospitalization  Has patient had a PCN reaction occurring within the last 10 years:  If all of the above answers are "NO", then may proceed with Cephalosporin use.  . Sulfa Antibiotics Swelling  . Tizanidine Hcl Anxiety     Current Outpatient Prescriptions  Medication Sig Dispense Refill  . albuterol (PROVENTIL HFA;VENTOLIN HFA) 108 (90 Base) MCG/ACT inhaler Inhale 1 puff into the lungs every 6 (six) hours as needed for wheezing or shortness of breath.    . allopurinol (ZYLOPRIM) 100 MG tablet Take 100 mg by mouth 2 (two) times daily.    Marland Kitchen ALPRAZolam (XANAX) 1 MG tablet Take 1 mg by mouth at bedtime as needed for anxiety.    . gabapentin (NEURONTIN) 300 MG capsule Take 300 mg by mouth 3 (three) times daily.    Marland Kitchen HYDROcodone-acetaminophen  (NORCO) 7.5-325 MG tablet Take 1 tablet by mouth every 6 (six) hours as needed for moderate pain.    Marland Kitchen levothyroxine (SYNTHROID, LEVOTHROID) 88 MCG tablet Take 88 mcg by mouth daily before breakfast.    . lisinopril (PRINIVIL,ZESTRIL) 10 MG tablet Take 10 mg by mouth daily.    . pravastatin (PRAVACHOL) 80 MG tablet Take 80 mg by mouth daily.     No current facility-administered medications for this visit.     Past Surgical History  Procedure Laterality Date  . Joint replacement    . Cholecystectomy    . Abdominal hysterectomy    . Thyroid surgery    . Ep implantable device N/A 01/28/2016    Procedure: Pacemaker Implant;  Surgeon: Deboraha Sprang, MD;  Location: [facility];  Service: Cardiovascular;  Laterality: N/A;     Allergies  Allergen Reactions  . Dilaudid [Hydromorphone Hcl] Anaphylaxis  . Penicillins Swelling    Has patient had a PCN reaction causing immediate rash, facial/tongue/throat swelling, SOB or lightheadedness with hypotension:  Has patient had a PCN reaction causing severe rash involving mucus membranes or skin necrosis:  Has patient had a PCN reaction that required hospitalization  Has patient had a PCN reaction occurring within the last 10 years:  If all of the above answers are "NO", then may proceed with Cephalosporin use.  . Sulfa Antibiotics Swelling  . Tizanidine Hcl Anxiety  Family History  Problem Relation Age of Onset  . Hypertension Mother      Social History Sharon Pineda reports that Sharon Pineda has never smoked. Sharon Pineda does not have any smokeless tobacco history on file. Sharon Pineda reports that Sharon Pineda does not drink alcohol.   Review of Systems CONSTITUTIONAL: mild fatigue HEENT: Eyes: No visual loss, blurred vision, double vision or yellow sclerae.No hearing loss, sneezing, congestion, runny nose or sore throat.  SKIN: No rash or itching.  CARDIOVASCULAR: per HPI RESPIRATORY: No shortness of breath, cough or sputum.  GASTROINTESTINAL: No  anorexia, nausea, vomiting or diarrhea. No abdominal pain or blood.  GENITOURINARY: No burning on urination, no polyuria NEUROLOGICAL: No headache, dizziness, syncope, paralysis, ataxia, numbness or tingling in the extremities. No change in bowel or bladder control.  MUSCULOSKELETAL: No muscle, back pain, joint pain or stiffness.  LYMPHATICS: No enlarged nodes. No history of splenectomy.  PSYCHIATRIC: No history of depression or anxiety.  ENDOCRINOLOGIC: No reports of sweating, cold or heat intolerance. No polyuria or polydipsia.  Marland Kitchen   Physical Examination Filed Vitals:   03/20/16 1415  BP: 128/80  Pulse: 72   Filed Vitals:   03/20/16 1415  Height: 5\' 4"  (1.626 m)  Weight: 180 lb 9.6 oz (81.92 kg)    Gen: resting comfortably, no acute distress HEENT: no scleral icterus, pupils equal round and reactive, no palptable cervical adenopathy,  CV: RRR, no mr/g, no jvd Resp: Clear to auscultation bilaterally GI: abdomen is soft, non-tender, non-distended, normal bowel sounds, no hepatosplenomegaly MSK: extremities are warm, no edema.  Skin: warm, no rash Neuro:  no focal deficits Psych: appropriate affect   Diagnostic Studies 01/2016 echo Study Conclusions  - Procedure narrative: Transthoracic echocardiography. Image  quality was poor. The study was technically difficult, as a  result of poor sound wave transmission and body habitus. - Left ventricle: The cavity size was normal. Wall thickness was  increased in a pattern of mild LVH. Systolic function was normal.  The estimated ejection fraction was in the range of 55% to 60%.  Wall motion was normal; there were no regional wall motion  abnormalities. - Aortic valve: There was mild regurgitation. - Mitral valve: There was mild regurgitation. - Left atrium: The atrium was mildly to moderately dilated.   01/2016 Lexiscan MPI IMPRESSION: 1. No reversible ischemia or infarction. Mild Decreased counts in the anterior  septal apical segment likely represent RV insertion or Breast attenuation.  2. Mild septal hypokinesia.  3. Left ventricular ejection fraction 75%  4. Low-risk stress test findings*.   Assessment and Plan  1. Bradycardia - s/p pacemaker placement, notes some recent fatigue, we will have her device checked in device clinic  2. Preoperative evaluation - extensive negative cardiac workup recently including echo, recommend proceeding with surgery as planned. Recommend having magnet available if needed during procedure.    F/u 6 months   Arnoldo Lenis, M.D.

## 2016-03-28 ENCOUNTER — Ambulatory Visit (INDEPENDENT_AMBULATORY_CARE_PROVIDER_SITE_OTHER): Payer: Medicare PPO | Admitting: *Deleted

## 2016-03-28 ENCOUNTER — Encounter: Payer: Self-pay | Admitting: Internal Medicine

## 2016-03-28 DIAGNOSIS — Z950 Presence of cardiac pacemaker: Secondary | ICD-10-CM | POA: Diagnosis not present

## 2016-03-28 DIAGNOSIS — R001 Bradycardia, unspecified: Secondary | ICD-10-CM | POA: Diagnosis not present

## 2016-03-28 LAB — CUP PACEART INCLINIC DEVICE CHECK
Battery Remaining Longevity: 104 mo
Battery Voltage: 3.03 V
Brady Statistic RA Percent Paced: 37.77 %
Brady Statistic RV Percent Paced: 90.5 %
Date Time Interrogation Session: 20170407155536
Implantable Lead Implant Date: 20170206
Implantable Lead Implant Date: 20170206
Implantable Lead Location: 753859
Implantable Lead Location: 753860
Implantable Lead Model: 5076
Implantable Lead Model: 5076
Lead Channel Impedance Value: 551 Ohm
Lead Channel Pacing Threshold Amplitude: 0.5 V
Lead Channel Pacing Threshold Pulse Width: 0.4 ms
Lead Channel Sensing Intrinsic Amplitude: 3 mV
Lead Channel Sensing Intrinsic Amplitude: 3.375 mV
Lead Channel Setting Sensing Sensitivity: 2.8 mV
MDC IDC MSMT LEADCHNL RA IMPEDANCE VALUE: 437 Ohm
MDC IDC MSMT LEADCHNL RA IMPEDANCE VALUE: 570 Ohm
MDC IDC MSMT LEADCHNL RV IMPEDANCE VALUE: 475 Ohm
MDC IDC MSMT LEADCHNL RV PACING THRESHOLD AMPLITUDE: 0.75 V
MDC IDC MSMT LEADCHNL RV PACING THRESHOLD PULSEWIDTH: 0.4 ms
MDC IDC MSMT LEADCHNL RV SENSING INTR AMPL: 11.75 mV
MDC IDC MSMT LEADCHNL RV SENSING INTR AMPL: 8.25 mV
MDC IDC SET LEADCHNL RA PACING AMPLITUDE: 3.5 V
MDC IDC SET LEADCHNL RV PACING AMPLITUDE: 3.5 V
MDC IDC SET LEADCHNL RV PACING PULSEWIDTH: 0.4 ms
MDC IDC STAT BRADY AP VP PERCENT: 37.05 %
MDC IDC STAT BRADY AP VS PERCENT: 0.72 %
MDC IDC STAT BRADY AS VP PERCENT: 53.45 %
MDC IDC STAT BRADY AS VS PERCENT: 8.78 %

## 2016-03-28 NOTE — Progress Notes (Signed)
Pacemaker check in clinic per Dr. Harl Bowie for SOB/fatigue with minimal activity. She denies chest pain or pressure. Normal device function. Thresholds, sensing, impedances consistent with previous measurements. Device programmed to maximize longevity. 1 SVT episode lasting 28 seconds @ 154bpm. Device programmed at appropriate safety margins. Histogram distribution appropriate for patient activity level. Although PPM not implanted for sinus node dysfunction, I turned rate response on at nominal settings- we will evaluate symptoms at 05/28/16 appt with Dr. Rayann Heman. Device programmed to optimize intrinsic conduction. Estimated longevity 7.5-9.5 years. Patient enrolled in remote follow-up. Carelink smart demonstrated this OV. ROV with JA in [place] 05/28/16 at 11:15.

## 2016-05-28 ENCOUNTER — Encounter: Payer: Self-pay | Admitting: Internal Medicine

## 2016-05-28 ENCOUNTER — Ambulatory Visit (INDEPENDENT_AMBULATORY_CARE_PROVIDER_SITE_OTHER): Payer: Medicare PPO | Admitting: Internal Medicine

## 2016-05-28 VITALS — BP 146/98 | HR 91 | Ht 64.0 in | Wt 180.2 lb

## 2016-05-28 DIAGNOSIS — I10 Essential (primary) hypertension: Secondary | ICD-10-CM

## 2016-05-28 DIAGNOSIS — R0602 Shortness of breath: Secondary | ICD-10-CM | POA: Diagnosis not present

## 2016-05-28 DIAGNOSIS — I441 Atrioventricular block, second degree: Secondary | ICD-10-CM | POA: Insufficient documentation

## 2016-05-28 DIAGNOSIS — Z950 Presence of cardiac pacemaker: Secondary | ICD-10-CM

## 2016-05-28 DIAGNOSIS — R5383 Other fatigue: Secondary | ICD-10-CM | POA: Diagnosis not present

## 2016-05-28 DIAGNOSIS — R001 Bradycardia, unspecified: Secondary | ICD-10-CM | POA: Diagnosis not present

## 2016-05-28 LAB — CUP PACEART INCLINIC DEVICE CHECK
Brady Statistic AP VP Percent: 5.85 %
Brady Statistic AP VS Percent: 0.34 %
Brady Statistic RA Percent Paced: 6.19 %
Brady Statistic RV Percent Paced: 91.45 %
Date Time Interrogation Session: 20170607120558
Implantable Lead Implant Date: 20170206
Implantable Lead Model: 5076
Implantable Lead Model: 5076
Lead Channel Impedance Value: 475 Ohm
Lead Channel Impedance Value: 551 Ohm
Lead Channel Pacing Threshold Pulse Width: 0.4 ms
Lead Channel Sensing Intrinsic Amplitude: 10.5 mV
Lead Channel Sensing Intrinsic Amplitude: 13.25 mV
Lead Channel Setting Pacing Amplitude: 2 V
Lead Channel Setting Pacing Amplitude: 2.5 V
Lead Channel Setting Pacing Pulse Width: 0.4 ms
Lead Channel Setting Sensing Sensitivity: 2.8 mV
MDC IDC LEAD IMPLANT DT: 20170206
MDC IDC LEAD LOCATION: 753859
MDC IDC LEAD LOCATION: 753860
MDC IDC MSMT BATTERY REMAINING LONGEVITY: 110 mo
MDC IDC MSMT BATTERY VOLTAGE: 3.03 V
MDC IDC MSMT LEADCHNL RA IMPEDANCE VALUE: 437 Ohm
MDC IDC MSMT LEADCHNL RA IMPEDANCE VALUE: 551 Ohm
MDC IDC MSMT LEADCHNL RA PACING THRESHOLD AMPLITUDE: 0.5 V
MDC IDC MSMT LEADCHNL RA SENSING INTR AMPL: 3.5 mV
MDC IDC MSMT LEADCHNL RA SENSING INTR AMPL: 3.875 mV
MDC IDC MSMT LEADCHNL RV PACING THRESHOLD AMPLITUDE: 0.5 V
MDC IDC MSMT LEADCHNL RV PACING THRESHOLD PULSEWIDTH: 0.4 ms
MDC IDC STAT BRADY AS VP PERCENT: 85.61 %
MDC IDC STAT BRADY AS VS PERCENT: 8.21 %

## 2016-05-28 NOTE — Progress Notes (Signed)
    PCP: Lanier Clam, MD Primary Cardiologist:  Dr Lavona Mound is a 75 y.o. female who presents today for routine electrophysiology followup.  Since her pacemaker implant by Dr Caryl Comes, the patient has had significant SOB and fatigue.  She states that she went to the hospital for "back surgery" and "ended up with a pacemaker".  She states that she was not aware of CV symptoms prior to PPM.  Currently, she is quite fatigued and has some SOB.    Today, she denies symptoms of palpitations, chest pain,  lower extremity edema, dizziness, presyncope, or syncope.  The patient is otherwise without complaint today.   Past Medical History  Diagnosis Date  . Kidney stones   . Hypertension   . Hyperlipidemia   . DDD (degenerative disc disease), thoracic   . Mobitz II   . Thyroid cancer [facility])     a. s/p thyroidecetomy   Past Surgical History  Procedure Laterality Date  . Joint replacement    . Cholecystectomy    . Abdominal hysterectomy    . Thyroid surgery    . Ep implantable device N/A 01/28/2016    MDT Advisa DR MRI pacemaker implanted by Dr Caryl Comes for 2:1 AV block    ROS- all systems are reviewed and negative except as per HPI above  Current Outpatient Prescriptions  Medication Sig Dispense Refill  . albuterol (PROVENTIL HFA;VENTOLIN HFA) 108 (90 Base) MCG/ACT inhaler Inhale 1 puff into the lungs every 6 (six) hours as needed for wheezing or shortness of breath.    . allopurinol (ZYLOPRIM) 100 MG tablet Take 100 mg by mouth 2 (two) times daily.    Marland Kitchen levothyroxine (SYNTHROID, LEVOTHROID) 88 MCG tablet Take 88 mcg by mouth daily before breakfast.    . lisinopril (PRINIVIL,ZESTRIL) 10 MG tablet Take 10 mg by mouth daily.     No current facility-administered medications for this visit.    Physical Exam: Filed Vitals:   05/28/16 1104  BP: 146/98  Pulse: 91  Height: 5\' 4"  (1.626 m)  Weight: 180 lb 3.2 oz (81.738 kg)  SpO2: 98%    GEN- The patient is well appearing, alert  and oriented x 3 today.   Head- normocephalic, atraumatic Eyes-  Sclera clear, conjunctiva pink Ears- hearing intact Oropharynx- clear Lungs- Clear to ausculation bilaterally, normal work of breathing Chest- pacemaker pocket is well healed Heart- Regular rate and rhythm, no murmurs, rubs or gallops, PMI not laterally displaced GI- soft, NT, ND, + BS Extremities- no clubbing, cyanosis, or edema  Pacemaker interrogation- reviewed in detail today,  See PACEART report  Assessment and Plan:  1. Mobitz II second degree AV block Normal pacemaker function See Pace Art report She has a preserved histrogram She has MVP on and is V paced >90% but has AV of 350 msec.  I suspect that this may be the cause of her symptoms I have turned MVP off today. Repeat echo No further EP workup planned unless EF has declined  2. SOB Repeat echo  Device reprogrammed as above  3. HTN Stable No change required today  carelink Return to see me in 1 year Thompson Grayer MD, [facility] 05/28/2016 11:41 AM

## 2016-05-28 NOTE — Patient Instructions (Signed)
Your physician recommends that you continue on your current medications as directed. Please refer to the Current Medication list given to you today. Your physician has requested that you have an echocardiogram. Echocardiography is a painless test that uses sound waves to create images of your heart. It provides your doctor with information about the size and shape of your heart and how well your heart's chambers and valves are working. This procedure takes approximately one hour. There are no restrictions for this procedure. Device check on 08/27/16. Your physician recommends that you schedule a follow-up appointment in: 1 year with Dr. Rayann Heman. Please schedule this appointment today before leaving the office. Please schedule your follow up visit with Dr. Harl Bowie before leaving the office today.

## 2016-06-04 ENCOUNTER — Other Ambulatory Visit: Payer: Self-pay

## 2016-06-04 ENCOUNTER — Ambulatory Visit (INDEPENDENT_AMBULATORY_CARE_PROVIDER_SITE_OTHER): Payer: Medicare PPO

## 2016-06-04 DIAGNOSIS — Z950 Presence of cardiac pacemaker: Secondary | ICD-10-CM

## 2016-06-04 DIAGNOSIS — R0602 Shortness of breath: Secondary | ICD-10-CM | POA: Diagnosis not present

## 2016-06-04 DIAGNOSIS — R5383 Other fatigue: Secondary | ICD-10-CM

## 2016-06-04 DIAGNOSIS — R001 Bradycardia, unspecified: Secondary | ICD-10-CM | POA: Diagnosis not present

## 2016-06-04 LAB — ECHOCARDIOGRAM COMPLETE
AVPHT: 727 ms
CHL CUP MV DEC (S): 346
E/e' ratio: 9.03
EWDT: 346 ms
FS: 30 % (ref 28–44)
IV/PV OW: 0.94
LA diam end sys: 35 mm
LA diam index: 1.8 cm/m2
LA vol index: 28.5 mL/m2
LASIZE: 35 mm
LAVOL: 55.5 mL
LAVOLA4C: 56.4 mL
LV E/e' medial: 9.03
LV PW d: 10.7 mm — AB (ref 0.6–1.1)
LV TDI E'LATERAL: 5.8
LV TDI E'MEDIAL: 6
LV dias vol index: 30 mL/m2
LV e' LATERAL: 5.8 cm/s
LVDIAVOL: 59 mL (ref 46–106)
LVEEAVG: 9.03
LVOT SV: 56 mL
LVOT VTI: 16.2 cm
LVOT area: 3.46 cm2
LVOT diameter: 21 mm
LVOTPV: 74.6 cm/s
LVSYSVOL: 33 mL (ref 14–42)
LVSYSVOLIN: 17 mL/m2
MVPKAVEL: 85.4 m/s
MVPKEVEL: 52.4 m/s
RV TAPSE: 17.5 mm
Simpson's disk: 45
Stroke v: 27 ml

## 2016-06-10 ENCOUNTER — Telehealth: Payer: Self-pay | Admitting: *Deleted

## 2016-06-10 NOTE — Telephone Encounter (Signed)
Patient informed. 

## 2016-08-27 ENCOUNTER — Telehealth: Payer: Self-pay | Admitting: Cardiology

## 2016-08-27 ENCOUNTER — Ambulatory Visit (INDEPENDENT_AMBULATORY_CARE_PROVIDER_SITE_OTHER): Payer: Medicare PPO | Admitting: *Deleted

## 2016-08-27 DIAGNOSIS — I441 Atrioventricular block, second degree: Secondary | ICD-10-CM

## 2016-08-27 DIAGNOSIS — Z950 Presence of cardiac pacemaker: Secondary | ICD-10-CM | POA: Diagnosis not present

## 2016-08-27 NOTE — Progress Notes (Signed)
Remote pacemaker transmission.   

## 2016-08-27 NOTE — Telephone Encounter (Signed)
Spoke with pt and reminded pt of remote transmission that is due today. Pt verbalized understanding.   

## 2016-08-29 ENCOUNTER — Encounter: Payer: Self-pay | Admitting: Cardiology

## 2016-09-06 LAB — CUP PACEART REMOTE DEVICE CHECK
Battery Remaining Longevity: 105 mo
Battery Voltage: 3.02 V
Brady Statistic AP VP Percent: 19.35 %
Brady Statistic AS VP Percent: 80.49 %
Brady Statistic RV Percent Paced: 99.84 %
Implantable Lead Implant Date: 20170206
Implantable Lead Location: 753859
Implantable Lead Model: 5076
Lead Channel Impedance Value: 494 Ohm
Lead Channel Impedance Value: 570 Ohm
Lead Channel Pacing Threshold Amplitude: 0.5 V
Lead Channel Sensing Intrinsic Amplitude: 10.125 mV
Lead Channel Sensing Intrinsic Amplitude: 10.125 mV
Lead Channel Sensing Intrinsic Amplitude: 3.375 mV
Lead Channel Setting Pacing Amplitude: 2 V
Lead Channel Setting Pacing Amplitude: 2.5 V
Lead Channel Setting Pacing Pulse Width: 0.4 ms
Lead Channel Setting Sensing Sensitivity: 2.8 mV
MDC IDC LEAD IMPLANT DT: 20170206
MDC IDC LEAD LOCATION: 753860
MDC IDC MSMT LEADCHNL RA IMPEDANCE VALUE: 418 Ohm
MDC IDC MSMT LEADCHNL RA IMPEDANCE VALUE: 589 Ohm
MDC IDC MSMT LEADCHNL RA PACING THRESHOLD PULSEWIDTH: 0.4 ms
MDC IDC MSMT LEADCHNL RA SENSING INTR AMPL: 3.375 mV
MDC IDC MSMT LEADCHNL RV PACING THRESHOLD AMPLITUDE: 0.625 V
MDC IDC MSMT LEADCHNL RV PACING THRESHOLD PULSEWIDTH: 0.4 ms
MDC IDC SESS DTM: 20170906143217
MDC IDC STAT BRADY AP VS PERCENT: 0.01 %
MDC IDC STAT BRADY AS VS PERCENT: 0.15 %
MDC IDC STAT BRADY RA PERCENT PACED: 19.36 %

## 2016-09-18 ENCOUNTER — Ambulatory Visit (INDEPENDENT_AMBULATORY_CARE_PROVIDER_SITE_OTHER): Payer: Medicare PPO | Admitting: Cardiology

## 2016-09-18 ENCOUNTER — Encounter: Payer: Self-pay | Admitting: Cardiology

## 2016-09-18 ENCOUNTER — Encounter: Payer: Self-pay | Admitting: *Deleted

## 2016-09-18 VITALS — BP 136/86 | HR 71 | Ht 64.0 in | Wt 185.8 lb

## 2016-09-18 DIAGNOSIS — R001 Bradycardia, unspecified: Secondary | ICD-10-CM | POA: Diagnosis not present

## 2016-09-18 DIAGNOSIS — R0602 Shortness of breath: Secondary | ICD-10-CM

## 2016-09-18 DIAGNOSIS — I10 Essential (primary) hypertension: Secondary | ICD-10-CM

## 2016-09-18 DIAGNOSIS — R0989 Other specified symptoms and signs involving the circulatory and respiratory systems: Secondary | ICD-10-CM

## 2016-09-18 NOTE — Progress Notes (Signed)
Clinical Summary Ms. Vitatoe is a 75 y.o.female seen for the following medical problems.  1. Bradycardia -  implant of dual chamber pacemaker 01/28/16 by Dr Caryl Comes - can have somelightheadness with standing.  - increased fatigue over the last few weeks. She reports for the first 3 weeks after pacemaker placement that has gradually declined.   - 08/27/16 normal device function. No significant lightheadness or dizziness.    2. SOB - repeat echo 05/2016 LVEF [id number], grade I diastolic dysfunction - no recent SOB or DOE. Improved since last visit  3. HTN - compliant with meds   Past Medical History:  Diagnosis Date  . DDD (degenerative disc disease), thoracic   . Hyperlipidemia   . Hypertension   . Kidney stones   . Mobitz II   . Thyroid cancer ([place])    a. s/p thyroidecetomy       Current Outpatient Prescriptions  Medication Sig Dispense Refill  . albuterol (PROVENTIL HFA;VENTOLIN HFA) 108 (90 Base) MCG/ACT inhaler Inhale 1 puff into the lungs every 6 (six) hours as needed for wheezing or shortness of breath.    . allopurinol (ZYLOPRIM) 100 MG tablet Take 100 mg by mouth 2 (two) times daily.    Marland Kitchen levothyroxine (SYNTHROID, LEVOTHROID) 88 MCG tablet Take 88 mcg by mouth daily before breakfast.    . lisinopril (PRINIVIL,ZESTRIL) 10 MG tablet Take 10 mg by mouth daily.     No current facility-administered medications for this visit.      Past Surgical History:  Procedure Laterality Date  . ABDOMINAL HYSTERECTOMY    . CHOLECYSTECTOMY    . 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 JOINT REPLACEMENT    . THYROID SURGERY       Allergies Dilaudid, PCN, sulfa, tizandine   Family History  Problem Relation Age of Onset  . Hypertension Mother      Social History Ms. Mitten reports that she has never smoked. She does not have any smokeless tobacco history on file. Ms. Etzel reports that she does  not drink alcohol.   Review of Systems CONSTITUTIONAL: No weight loss, fever, chills, weakness or fatigue.  HEENT: Eyes: No visual loss, blurred vision, double vision or yellow sclerae.No hearing loss, sneezing, congestion, runny nose or sore throat.  SKIN: No rash or itching.  CARDIOVASCULAR: per HPI RESPIRATORY: No shortness of breath, cough or sputum.  GASTROINTESTINAL: No anorexia, nausea, vomiting or diarrhea. No abdominal pain or blood.  GENITOURINARY: No burning on urination, no polyuria NEUROLOGICAL: No headache, dizziness, syncope, paralysis, ataxia, numbness or tingling in the extremities. No change in bowel or bladder control.  MUSCULOSKELETAL: No muscle, back pain, joint pain or stiffness.  LYMPHATICS: No enlarged nodes. No history of splenectomy.  PSYCHIATRIC: No history of depression or anxiety.  ENDOCRINOLOGIC: No reports of sweating, cold or heat intolerance. No polyuria or polydipsia.  Marland Kitchen   Physical Examination Vitals:   09/18/16 0841  BP: 136/86  Pulse: 71   Vitals:   09/18/16 0841  Weight: 185 lb 12.8 oz (84.3 kg)  Height: 5\' 4"  (1.626 m)    Gen: resting comfortably, no acute distress HEENT: no scleral icterus, pupils equal round and reactive, no palptable cervical adenopathy,  CV: RRR, no m/r/g, no jvd. Bilateral carotid bruits Resp: Clear to auscultation bilaterally GI: abdomen is soft, non-tender, non-distended, normal bowel sounds, no hepatosplenomegaly MSK: extremities are warm, no edema.  Skin: warm,  no rash Neuro:  no focal deficits Psych: appropriate affect   Diagnostic Studies 01/2016 echo Study Conclusions  - Procedure narrative: Transthoracic echocardiography. Image  quality was poor. The study was technically difficult, as a  result of poor sound wave transmission and body habitus. - Left ventricle: The cavity size was normal. Wall thickness was  increased in a pattern of mild LVH. Systolic function was normal.  The estimated  ejection fraction was in the range of 55% to 60%.  Wall motion was normal; there were no regional wall motion  abnormalities. - Aortic valve: There was mild regurgitation. - Mitral valve: There was mild regurgitation. - Left atrium: The atrium was mildly to moderately dilated.   01/2016 Lexiscan MPI IMPRESSION: 1. No reversible ischemia or infarction. Mild Decreased counts in the anterior septal apical segment likely represent RV insertion or Breast attenuation.  2. Mild septal hypokinesia.  3. Left ventricular ejection fraction 75%  4. Low-risk stress test findings*.   05/2016 echo Study Conclusions  - Left ventricle: The cavity size was normal. Wall thickness was   normal. Systolic function was mildly reduced. The estimated   ejection fraction was in the range of 45% to 50%. Doppler   parameters are consistent with abnormal left ventricular   relaxation (grade 1 diastolic dysfunction). - Aortic valve: Mildly calcified annulus. Trileaflet; mildly   calcified leaflets. There was trivial regurgitation. - Mitral valve: Calcified annulus. There was mild regurgitation. - Left atrium: The atrium was mildly dilated. - Right ventricle: Pacer wire or catheter noted in right ventricle. - Right atrium: Central venous pressure (est): 3 mm Hg. - Atrial septum: No defect or patent foramen ovale was identified. - Tricuspid valve: There was trivial regurgitation. - Pulmonary arteries: PA peak pressure: 22 mm Hg (S). - Pericardium, extracardiac: There was no pericardial effusion.  Impressions:  - Normal LV wall thickness with LVEF approximately 45-50%. There is   septal dyssynergy also involving the inferior apical wall that is   suggestive of ventricular pacing. Grade 1 diastolic dysfunction.   Mild left atrial enlargement. MAC with mild mitral regurgitation.   Mildly sclerotic aortic valve with trivial aortic regurgitation.   Trivial tricuspid regurgitation with PASP 22  mmHg. Device wire   present in the right heart. No pericardial effusion.   Assessment and Plan  1. Bradycardia - s/p pacemaker placement, she is doing well - continue to monitor.   2. SOB - resolved. Recent negative cardiac workup for recent preoperative testing - continue to monitor  3. HTN - at goal, continue current meds  4. Carotid bruit - obtain carotid US   F/u 1 year      Arnoldo Lenis, M.D.,

## 2016-09-18 NOTE — Patient Instructions (Signed)
Your physician wants you to follow-up in: 1 YEAR WITH DR. BRANCH  You will receive a reminder letter in the mail two months in advance. If you don't receive a letter, please call our office to schedule the follow-up appointment.  Your physician recommends that you continue on your current medications as directed. Please refer to the Current Medication list given to you today.  Your physician has requested that you have a carotid duplex. This test is an ultrasound of the carotid arteries in your neck. It looks at blood flow through these arteries that supply the brain with blood. Allow one hour for this exam. There are no restrictions or special instructions.  Thank you for choosing [facility]!!   

## 2016-10-23 ENCOUNTER — Ambulatory Visit: Payer: Medicare PPO

## 2016-10-23 DIAGNOSIS — R0989 Other specified symptoms and signs involving the circulatory and respiratory systems: Secondary | ICD-10-CM

## 2016-10-23 LAB — VAS US CAROTID
LCCADDIAS: -15 cm/s
LCCADSYS: -63 cm/s
LCCAPSYS: 111 cm/s
LEFT ECA DIAS: 0 cm/s
LEFT VERTEBRAL DIAS: -20 cm/s
LICADSYS: -109 cm/s
LICAPSYS: -113 cm/s
Left CCA prox dias: 15 cm/s
Left ICA dist dias: -32 cm/s
Left ICA prox dias: -18 cm/s
RCCADSYS: -72 cm/s
RCCAPSYS: 139 cm/s
RIGHT ECA DIAS: -15 cm/s
Right CCA prox dias: 25 cm/s

## 2016-10-29 ENCOUNTER — Telehealth: Payer: Self-pay | Admitting: *Deleted

## 2016-10-29 NOTE — Telephone Encounter (Signed)
Notes Recorded by Arnoldo Lenis, MD on 10/27/2016 at 3:06 PM EST Carotid US shows mild blockages in arteries in neck and right arm, we will continue to monitor  Pt made aware - routed to pcp

## 2016-11-26 ENCOUNTER — Ambulatory Visit (INDEPENDENT_AMBULATORY_CARE_PROVIDER_SITE_OTHER): Payer: Medicare PPO | Admitting: *Deleted

## 2016-11-26 DIAGNOSIS — I441 Atrioventricular block, second degree: Secondary | ICD-10-CM | POA: Diagnosis not present

## 2016-11-26 NOTE — Progress Notes (Signed)
Remote pacemaker transmission.   

## 2016-12-03 ENCOUNTER — Encounter: Payer: Self-pay | Admitting: Cardiology

## 2016-12-23 LAB — CUP PACEART REMOTE DEVICE CHECK
Battery Remaining Longevity: 98 mo
Battery Voltage: 3.01 V
Brady Statistic AP VP Percent: 32.28 %
Brady Statistic AP VS Percent: 0.01 %
Brady Statistic AS VS Percent: 0.12 %
Brady Statistic RV Percent Paced: 99.38 %
Date Time Interrogation Session: 20171206151651
Implantable Lead Implant Date: 20170206
Implantable Lead Location: 753859
Implantable Lead Model: 5076
Implantable Pulse Generator Implant Date: 20170206
Lead Channel Impedance Value: 475 Ohm
Lead Channel Impedance Value: 551 Ohm
Lead Channel Impedance Value: 551 Ohm
Lead Channel Pacing Threshold Amplitude: 0.625 V
Lead Channel Sensing Intrinsic Amplitude: 2.875 mV
Lead Channel Sensing Intrinsic Amplitude: 31.625 mV
Lead Channel Setting Pacing Amplitude: 2 V
Lead Channel Setting Pacing Amplitude: 2.5 V
Lead Channel Setting Sensing Sensitivity: 2.8 mV
MDC IDC LEAD IMPLANT DT: 20170206
MDC IDC LEAD LOCATION: 753860
MDC IDC MSMT LEADCHNL RA IMPEDANCE VALUE: 418 Ohm
MDC IDC MSMT LEADCHNL RA PACING THRESHOLD AMPLITUDE: 0.5 V
MDC IDC MSMT LEADCHNL RA PACING THRESHOLD PULSEWIDTH: 0.4 ms
MDC IDC MSMT LEADCHNL RA SENSING INTR AMPL: 2.875 mV
MDC IDC MSMT LEADCHNL RV PACING THRESHOLD PULSEWIDTH: 0.4 ms
MDC IDC MSMT LEADCHNL RV SENSING INTR AMPL: 31.625 mV
MDC IDC SET LEADCHNL RV PACING PULSEWIDTH: 0.4 ms
MDC IDC STAT BRADY AS VP PERCENT: 67.59 %
MDC IDC STAT BRADY RA PERCENT PACED: 31.82 %

## 2017-01-21 ENCOUNTER — Telehealth: Payer: Self-pay | Admitting: Internal Medicine

## 2017-01-21 NOTE — Telephone Encounter (Signed)
Mrs. Zapalac called the after hours line because she was having chest pain.  Patient reports ongoing chest pain since Monday.  She was recently hospitalized for her chest pain and stated that the work up was normal and no concern for a heart attack.  She continues to have ongoing pain at this time.  I informed patient that if she is still having chest pain, she should call 911 or report to her local emergency department for further evaluation.  Liborio Nixon, MD

## 2017-01-22 ENCOUNTER — Encounter (HOSPITAL_COMMUNITY): Payer: Self-pay | Admitting: *Deleted

## 2017-01-22 ENCOUNTER — Inpatient Hospital Stay (HOSPITAL_COMMUNITY)
Admission: EM | Admit: 2017-01-22 | Discharge: 2017-01-27 | DRG: 835 | Disposition: A | Discharge status: Home / Self Care | Payer: Medicare PPO | Attending: Internal Medicine | Admitting: Internal Medicine

## 2017-01-22 ENCOUNTER — Emergency Department (HOSPITAL_COMMUNITY): Payer: Medicare PPO

## 2017-01-22 ENCOUNTER — Observation Stay (HOSPITAL_COMMUNITY): Payer: Medicare PPO

## 2017-01-22 DIAGNOSIS — Z882 Allergy status to sulfonamides status: Secondary | ICD-10-CM

## 2017-01-22 DIAGNOSIS — I441 Atrioventricular block, second degree: Secondary | ICD-10-CM | POA: Diagnosis present

## 2017-01-22 DIAGNOSIS — Z880 Allergy status to penicillin: Secondary | ICD-10-CM

## 2017-01-22 DIAGNOSIS — C9200 Acute myeloblastic leukemia, not having achieved remission: Secondary | ICD-10-CM | POA: Diagnosis not present

## 2017-01-22 DIAGNOSIS — I10 Essential (primary) hypertension: Secondary | ICD-10-CM | POA: Diagnosis not present

## 2017-01-22 DIAGNOSIS — Z79899 Other long term (current) drug therapy: Secondary | ICD-10-CM

## 2017-01-22 DIAGNOSIS — E039 Hypothyroidism, unspecified: Secondary | ICD-10-CM | POA: Diagnosis present

## 2017-01-22 DIAGNOSIS — E038 Other specified hypothyroidism: Secondary | ICD-10-CM | POA: Diagnosis not present

## 2017-01-22 DIAGNOSIS — D61818 Other pancytopenia: Secondary | ICD-10-CM

## 2017-01-22 DIAGNOSIS — R079 Chest pain, unspecified: Secondary | ICD-10-CM | POA: Diagnosis present

## 2017-01-22 DIAGNOSIS — I2510 Atherosclerotic heart disease of native coronary artery without angina pectoris: Secondary | ICD-10-CM | POA: Diagnosis present

## 2017-01-22 DIAGNOSIS — Z8249 Family history of ischemic heart disease and other diseases of the circulatory system: Secondary | ICD-10-CM

## 2017-01-22 DIAGNOSIS — N179 Acute kidney failure, unspecified: Secondary | ICD-10-CM | POA: Diagnosis present

## 2017-01-22 DIAGNOSIS — E785 Hyperlipidemia, unspecified: Secondary | ICD-10-CM | POA: Diagnosis present

## 2017-01-22 DIAGNOSIS — D696 Thrombocytopenia, unspecified: Secondary | ICD-10-CM | POA: Diagnosis present

## 2017-01-22 DIAGNOSIS — D649 Anemia, unspecified: Secondary | ICD-10-CM | POA: Diagnosis present

## 2017-01-22 DIAGNOSIS — M549 Dorsalgia, unspecified: Secondary | ICD-10-CM | POA: Diagnosis not present

## 2017-01-22 DIAGNOSIS — M79603 Pain in arm, unspecified: Secondary | ICD-10-CM | POA: Diagnosis present

## 2017-01-22 DIAGNOSIS — M546 Pain in thoracic spine: Secondary | ICD-10-CM | POA: Diagnosis not present

## 2017-01-22 DIAGNOSIS — D72829 Elevated white blood cell count, unspecified: Secondary | ICD-10-CM | POA: Diagnosis present

## 2017-01-22 DIAGNOSIS — I319 Disease of pericardium, unspecified: Secondary | ICD-10-CM

## 2017-01-22 DIAGNOSIS — D72820 Lymphocytosis (symptomatic): Secondary | ICD-10-CM

## 2017-01-22 DIAGNOSIS — Z85850 Personal history of malignant neoplasm of thyroid: Secondary | ICD-10-CM

## 2017-01-22 DIAGNOSIS — Z950 Presence of cardiac pacemaker: Secondary | ICD-10-CM

## 2017-01-22 DIAGNOSIS — Z87442 Personal history of urinary calculi: Secondary | ICD-10-CM

## 2017-01-22 DIAGNOSIS — Z515 Encounter for palliative care: Secondary | ICD-10-CM | POA: Diagnosis present

## 2017-01-22 DIAGNOSIS — Z888 Allergy status to other drugs, medicaments and biological substances status: Secondary | ICD-10-CM

## 2017-01-22 DIAGNOSIS — G8929 Other chronic pain: Secondary | ICD-10-CM | POA: Diagnosis present

## 2017-01-22 DIAGNOSIS — E890 Postprocedural hypothyroidism: Secondary | ICD-10-CM | POA: Diagnosis present

## 2017-01-22 DIAGNOSIS — G4733 Obstructive sleep apnea (adult) (pediatric): Secondary | ICD-10-CM | POA: Diagnosis present

## 2017-01-22 DIAGNOSIS — E876 Hypokalemia: Secondary | ICD-10-CM | POA: Diagnosis not present

## 2017-01-22 HISTORY — DX: Malignant (primary) neoplasm, unspecified: C80.1

## 2017-01-22 LAB — CBC
HEMATOCRIT: 31.3 % — AB (ref 36.0–46.0)
Hemoglobin: 10.5 g/dL — ABNORMAL LOW (ref 12.0–15.0)
MCH: 31.7 pg (ref 26.0–34.0)
MCHC: 33.5 g/dL (ref 30.0–36.0)
MCV: 94.6 fL (ref 78.0–100.0)
Platelets: 79 10*3/uL — ABNORMAL LOW (ref 150–400)
RBC: 3.31 MIL/uL — ABNORMAL LOW (ref 3.87–5.11)
RDW: 18.4 % — AB (ref 11.5–15.5)
WBC: 27.3 10*3/uL — ABNORMAL HIGH (ref 4.0–10.5)

## 2017-01-22 LAB — I-STAT TROPONIN, ED
Troponin i, poc: 0.06 ng/mL (ref 0.00–0.08)
Troponin i, poc: 0.11 ng/mL (ref 0.00–0.08)

## 2017-01-22 LAB — TROPONIN I
TROPONIN I: 0.2 ng/mL — AB (ref <0.03)
TROPONIN I: 0.18 ng/mL — AB (ref <0.03)
Troponin I: 0.19 ng/mL (ref <0.03)

## 2017-01-22 LAB — URINALYSIS, ROUTINE W REFLEX MICROSCOPIC
Bilirubin Urine: NEGATIVE
GLUCOSE, UA: NEGATIVE mg/dL
Ketones, ur: NEGATIVE mg/dL
Leukocytes, UA: NEGATIVE
Nitrite: NEGATIVE
PH: 5.5 (ref 5.0–8.0)
PROTEIN: 100 mg/dL — AB
Specific Gravity, Urine: 1.03 (ref 1.005–1.030)

## 2017-01-22 LAB — DIFFERENTIAL
BASOS PCT: 0 %
Band Neutrophils: 1 %
Basophils Absolute: 0 10*3/uL (ref 0.0–0.1)
Blasts: 0 %
EOS ABS: 0.3 10*3/uL (ref 0.0–0.7)
Eosinophils Relative: 1 %
Lymphocytes Relative: 27 %
Lymphs Abs: 7.4 10*3/uL — ABNORMAL HIGH (ref 0.7–4.0)
MONO ABS: 18.5 10*3/uL — AB (ref 0.1–1.0)
MYELOCYTES: 0 %
Metamyelocytes Relative: 0 %
Monocytes Relative: 68 %
NRBC: 0 /100{WBCs}
Neutro Abs: 1.1 10*3/uL — ABNORMAL LOW (ref 1.7–7.7)
Neutrophils Relative %: 3 %
Other: 0 %
PROMYELOCYTES ABS: 0 %

## 2017-01-22 LAB — BASIC METABOLIC PANEL
Anion gap: 12 (ref 5–15)
BUN: 11 mg/dL (ref 6–20)
CALCIUM: 8.6 mg/dL — AB (ref 8.9–10.3)
CO2: 21 mmol/L — AB (ref 22–32)
Chloride: 101 mmol/L (ref 101–111)
Creatinine, Ser: 0.81 mg/dL (ref 0.44–1.00)
GFR calc Af Amer: >60 mL/min (ref >60)
GFR calc non Af Amer: >60 mL/min (ref >60)
GLUCOSE: 183 mg/dL — AB (ref 65–99)
Potassium: 4 mmol/L (ref 3.5–5.1)
Sodium: 134 mmol/L — ABNORMAL LOW (ref 135–145)

## 2017-01-22 LAB — URINALYSIS, MICROSCOPIC (REFLEX): WBC, UA: NONE SEEN WBC/hpf (ref 0–5)

## 2017-01-22 LAB — LIPASE, BLOOD: LIPASE: 21 U/L (ref 11–51)

## 2017-01-22 LAB — SEDIMENTATION RATE: Sed Rate: 58 mm/hr — ABNORMAL HIGH (ref 0–22)

## 2017-01-22 LAB — I-STAT CG4 LACTIC ACID, ED
LACTIC ACID, VENOUS: 1.19 mmol/L (ref 0.5–1.9)
Lactic Acid, Venous: 2.16 mmol/L (ref 0.5–1.9)

## 2017-01-22 LAB — C-REACTIVE PROTEIN: CRP: 10.7 mg/dL — ABNORMAL HIGH (ref <1.0)

## 2017-01-22 MED ORDER — ALLOPURINOL 100 MG PO TABS
100.0000 mg | ORAL_TABLET | Freq: Two times a day (BID) | ORAL | Status: DC
  Administered 2017-01-22 – 2017-01-27 (×11): 100 mg via ORAL
  Filled 2017-01-22 (×11): qty 1

## 2017-01-22 MED ORDER — LEVOTHYROXINE SODIUM 88 MCG PO TABS
88.0000 ug | ORAL_TABLET | Freq: Every day | ORAL | Status: DC
  Administered 2017-01-23 – 2017-01-27 (×5): 88 ug via ORAL
  Filled 2017-01-22 (×5): qty 1

## 2017-01-22 MED ORDER — ONDANSETRON HCL 4 MG/2ML IJ SOLN
4.0000 mg | Freq: Once | INTRAMUSCULAR | Status: AC
  Administered 2017-01-22: 4 mg via INTRAVENOUS
  Filled 2017-01-22: qty 2

## 2017-01-22 MED ORDER — IOPAMIDOL (ISOVUE-370) INJECTION 76%
INTRAVENOUS | Status: AC
  Administered 2017-01-22: 100 mL
  Filled 2017-01-22: qty 100

## 2017-01-22 MED ORDER — LISINOPRIL 10 MG PO TABS
10.0000 mg | ORAL_TABLET | Freq: Every day | ORAL | Status: DC
  Administered 2017-01-22 – 2017-01-25 (×4): 10 mg via ORAL
  Filled 2017-01-22 (×4): qty 1

## 2017-01-22 MED ORDER — ASPIRIN 81 MG PO CHEW
324.0000 mg | CHEWABLE_TABLET | Freq: Once | ORAL | Status: AC
  Administered 2017-01-22: 324 mg via ORAL
  Filled 2017-01-22: qty 4

## 2017-01-22 MED ORDER — MORPHINE SULFATE (PF) 4 MG/ML IV SOLN
2.0000 mg | INTRAVENOUS | Status: DC | PRN
  Administered 2017-01-22 – 2017-01-24 (×6): 2 mg via INTRAVENOUS
  Filled 2017-01-22 (×7): qty 1

## 2017-01-22 MED ORDER — GI COCKTAIL ~~LOC~~
30.0000 mL | Freq: Once | ORAL | Status: AC
  Administered 2017-01-22: 30 mL via ORAL
  Filled 2017-01-22: qty 30

## 2017-01-22 MED ORDER — SODIUM CHLORIDE 0.9 % IV BOLUS (SEPSIS)
1000.0000 mL | Freq: Once | INTRAVENOUS | Status: AC
  Administered 2017-01-22: 1000 mL via INTRAVENOUS

## 2017-01-22 MED ORDER — ONDANSETRON HCL 4 MG/2ML IJ SOLN: 4.0000 mg | Freq: Four times a day (QID) | INTRAMUSCULAR | Status: DC | PRN

## 2017-01-22 MED ORDER — FENTANYL CITRATE (PF) 100 MCG/2ML IJ SOLN
50.0000 ug | Freq: Once | INTRAMUSCULAR | Status: AC
  Administered 2017-01-22: 50 ug via INTRAVENOUS
  Filled 2017-01-22: qty 2

## 2017-01-22 MED ORDER — MORPHINE SULFATE (PF) 4 MG/ML IV SOLN
4.0000 mg | Freq: Once | INTRAVENOUS | Status: AC
  Administered 2017-01-22: 4 mg via INTRAVENOUS
  Filled 2017-01-22: qty 1

## 2017-01-22 MED ORDER — HYDROCODONE-ACETAMINOPHEN 5-325 MG PO TABS
1.0000 | ORAL_TABLET | Freq: Four times a day (QID) | ORAL | Status: DC | PRN
  Administered 2017-01-22 – 2017-01-26 (×8): 1 via ORAL
  Filled 2017-01-22 (×9): qty 1

## 2017-01-22 MED ORDER — GADOBENATE DIMEGLUMINE 529 MG/ML IV SOLN
15.0000 mL | Freq: Once | INTRAVENOUS | Status: AC | PRN
  Administered 2017-01-22: 15 mL via INTRAVENOUS

## 2017-01-22 MED ORDER — ONDANSETRON HCL 4 MG PO TABS: 4.0000 mg | ORAL_TABLET | Freq: Four times a day (QID) | ORAL | Status: DC | PRN

## 2017-01-22 NOTE — ED Notes (Signed)
MRI notified that their RN is not available to do patient's MRI. Pt. Must be monitored throughout exam because of pacemaker. Emergency department RN unable to leave assignment to monitor patient during exam.

## 2017-01-22 NOTE — ED Notes (Signed)
Daughter at bedside.

## 2017-01-22 NOTE — ED Notes (Signed)
Lunch ordered 

## 2017-01-22 NOTE — ED Notes (Signed)
Troponin 0.19 Callie RN aware

## 2017-01-22 NOTE — ED Notes (Signed)
Pt. Transported to unit with clothing, phone, and phone charger. RN Romelle Starcher took report over phone.

## 2017-01-22 NOTE — H&P (Signed)
History and Physical  Patient Name: Sharon Pineda     [id number]    DOB: 1941-07-27    DOA: 01/22/2017 PCP: Lanier Clam, MD   Patient coming from: [facility] [facility]  Chief Complaint: Back pain, chest pain, arm pain  HPI: Sharon Pineda is a 76 y.o. female with a past medical history significant for CAD, type 2 mobitz with pacer, HTN, thryoid CA s/p thyroidectomy, OSA on CPAP, fibromyalgia who presents with chest and back pain for 1 week.  The patient was in her usual state of health until Jan 26 when she woke with severe chest, back and neck pain (chest pain, radiating to the back and radiating up over the right shoulder).  This was more or less constant, flaring at times, making it impossible to sleep, not worse with movement, position, exertion.  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  Yesterday, she went home from the hospital and lay down but was miserable, so she came to the ER here for a second opinion.  ED course: -Temp 99.42F, heart rate 71, respirations and pulse ox normal, BP 142, 67 -Na 134, K 4.0, Cr 0.81, WBC 27K, Hgb 10.5, platelets 79K -Initial troponin negative -Lactic acid 2.16 -There was concern for aortic dissection because she seemed so restless and so CT dissection protocol of the chest abdomen and pelvis was obtained that had no obvious findings, no dissection -She was given fentanyl for pain and TRH were asked to evaluate for admission     There is some associated right arm numbness and tingling in her left arm sometimes, but this is inconsistent.  No  fevers, chills.  No leg pain, leg numbness, difficulty walking, arm or leg weakness.  She has chronic back and neck pain, gets lumbar and cervical spine epidural steroid injections occasionally.  Possibly increased night sweats.         ROS: Review of Systems  Constitutional: Positive for diaphoresis.  Cardiovascular: Positive for chest pain.  Musculoskeletal: Positive for back pain. Negative for falls, joint pain and myalgias.  Neurological: Positive for tingling and sensory change. Negative for dizziness, tremors, speech change, focal weakness, seizures, loss of consciousness and headaches.  All other systems reviewed and are negative.         Past Medical History:  Diagnosis Date  . DDD (degenerative disc disease), thoracic   . Hyperlipidemia   . Hypertension   . Kidney stones   . Mobitz II   . Thyroid cancer ([place])    a. s/p thyroidecetomy    Past Surgical History:  Procedure Laterality Date  . ABDOMINAL HYSTERECTOMY    . CHOLECYSTECTOMY    . 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 JOINT REPLACEMENT    . THYROID SURGERY      Social History: Patient lives with her son.  The patient walks unassisted.  She still drives.  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  She did not smoke.    Allergies  Allergen Reactions  . Dilaudid [  Hydromorphone Hcl] Anaphylaxis  . Penicillins Swelling    Has patient had above answers are "NO", then may proceed with Cephalosporin use.  . Sulfa Antibiotics Swelling  . Tizanidine Hcl Anxiety    Family history: family history includes Hypertension in her father and mother; Lung cancer in her mother; Stomach cancer in her father.  Prior to Admission medications   Medication Sig Start Date End Date Taking? Authorizing Provider  albuterol (PROVENTIL HFA;VENTOLIN HFA) 108 (90 Base) MCG/ACT inhaler Inhale 1 puff into the lungs every 6 (six) hours as needed for wheezing or shortness  of breath.   Yes Historical Provider, MD  allopurinol (ZYLOPRIM) 100 MG tablet Take 100 mg by mouth 2 (two) times daily.   Yes Historical Provider, MD  HYDROcodone-acetaminophen (NORCO/VICODIN) 5-325 MG tablet Take 1 tablet by mouth every 6 (six) hours as needed for moderate pain.   Yes Historical Provider, MD  levothyroxine (SYNTHROID, LEVOTHROID) 88 MCG tablet Take 88 mcg by mouth daily before breakfast.   Yes Historical Provider, MD  lisinopril (PRINIVIL,ZESTRIL) 10 MG tablet Take 10 mg by mouth daily.   Yes Historical Provider, MD       Physical Exam: BP 136/68   Pulse 73   Temp 99.4 F (37.4 C) (Rectal)   Resp 16   SpO2 94%  General appearance: Thin adult female, alert and in no aute distress.   Eyes: Anicteric, conjunctiva pink, lids and lashes normal. PERRL.    ENT: No nasal deformity, discharge, epistaxis.  Hearing normal. OP moist without lesions.  Poor dentition. Neck: No neck masses.  Trachea midline.  No thyromegaly/tenderness. Lymph: No cervical or supraclavicular lymphadenopathy. Skin: Warm and dry.  No jaundice.  No suspicious rashes or lesions. Cardiac: RRR, nl S1-S2, no murmur appreciated.  Capillary refill is brisk.  JVP normal.  No LE edema.  Radial and DP pulses 2+ and symmetric. Respiratory: Normal respiratory rate and rhythm.  CTAB without rales or wheezes. Abdomen: Abdomen soft.  No TTP. No ascites, distension, hepatosplenomegaly.   MSK: No deformities or effusions.  No cyanosis or clubbing. Neuro: Cranial nerves 3-12 intact.  Sensation intact to light touch. Speech is fluent.  Muscle strength 5/5 and symmetric.  Patellar reflexes normal.    Psych: Sensorium intact and responding to questions, attention normal.  Behavior appropriate.  Affect normal.  Judgment and insight appear normal.     Labs on Admission:  I have personally reviewed following labs and imaging studies: CBC:  Recent Labs Lab 01/22/17 0050  WBC 27.3*  NEUTROABS PENDING  HGB 10.5*  HCT  31.3*  MCV 94.6  PLT 79*   Basic Metabolic Panel:  Recent Labs Lab 01/22/17 0050  NA 134*  K 4.0  CL 101  CO2 21*  GLUCOSE 183*  BUN 11  CREATININE 0.81  CALCIUM 8.6*   GFR: CrCl cannot be calculated (Unknown ideal weight.).  Liver Function Tests: No results for input(s): AST, ALT, ALKPHOS, BILITOT, PROT, ALBUMIN in the last 168 hours.  Recent Labs Lab 01/22/17 0453  LIPASE 21   No results for input(s): AMMONIA in the last 168 hours. Coagulation Profile: No results for input(s): INR, PROTIME in the last 168 hours. Cardiac Enzymes: No results for input(s): CKTOTAL, CKMB, CKMBINDEX, TROPONINI in the last 168 hours. BNP (last 3 results) No results for input(s): PROBNP in the last 8760 hours. HbA1C: No results for input(s): HGBA1C in the last 72 hours. CBG: No results for input(s): GLUCAP in the last 168 hours. Lipid Profile: No results  for input(s): CHOL, HDL, LDLCALC, TRIG, CHOLHDL, LDLDIRECT in the last 72 hours. Thyroid Function Tests: No results for input(s): TSH, T4TOTAL, FREET4, T3FREE, THYROIDAB in the last 72 hours. Anemia Panel: No results for input(s): VITAMINB12, FOLATE, FERRITIN, TIBC, IRON, RETICCTPCT in the last 72 hours. Sepsis Labs: Lactic acid 2.21 Invalid input(s): PROCALCITONIN, LACTICIDVEN No results found for this or any previous visit (from the past 240 hour(s)).       Radiological Exams on Admission: Personally reviewed CXR shows no focal opacity; CTA report reviewed: Dg Chest 2 View  Result Date: 01/22/2017 CLINICAL DATA:  Acute onset of mid chest pain and pain between the scapula. Initial encounter. EXAM: CHEST  2 VIEW COMPARISON:  Chest radiograph from 01/29/2016 FINDINGS: The lungs are well-aerated. Minimal left basilar atelectasis is noted. There is no evidence of focal opacification, pleural effusion or pneumothorax. The heart is borderline normal in size. A pacemaker is noted at the left chest wall, with leads ending at the right  atrium and right ventricle. No acute osseous abnormalities are seen. Clips are noted within the right upper quadrant, reflecting prior cholecystectomy. A bone island is seen at the proximal right humerus. IMPRESSION: Minimal left basilar atelectasis noted.  Lungs otherwise clear. Electronically Signed   By: Garald Balding M.D.   On: 01/22/2017 01:19   Ct Angio Chest/abd/pel For Dissection W And/or Wo Contrast  Result Date: 01/22/2017 CLINICAL DATA:  Midsternal chest pain radiating into both upper extremities and to the back. Nausea. Recent hospital discharge after full cardiac workup for pain. EXAM: CT ANGIOGRAPHY CHEST, ABDOMEN AND PELVIS TECHNIQUE: Multidetector CT imaging through the chest, abdomen and pelvis was performed using the standard protocol during bolus administration of intravenous contrast. Multiplanar reconstructed images and MIPs were obtained and reviewed to evaluate the vascular anatomy. CONTRAST:  100 mL Isovue 370 intravenous COMPARISON:  Radiographs 01/22/2017 FINDINGS: CTA CHEST FINDINGS Cardiovascular: Preferential opacification of the thoracic aorta. The aorta is normal in caliber with moderate atherosclerotic plaque but no aneurysm or dissection. Proximal great vessels are patent. Pulmonary arterial vasculature is also well opacified with no evidence of embolism. Moderate coronary artery calcified plaque. Mediastinum/Nodes: No enlarged mediastinal, hilar, or axillary lymph nodes. Thyroid gland, trachea, and esophagus demonstrate no significant findings. Lungs/Pleura: Lungs are clear. No pleural effusion or pneumothorax. Musculoskeletal: No chest wall abnormality. No acute or significant osseous findings. Review of the MIP images confirms the above findings. CTA ABDOMEN AND PELVIS FINDINGS VASCULAR Aorta: Normal caliber abdominal aorta with moderate atherosclerotic plaque. No dissection or significant stenosis. Celiac: Patent without evidence of aneurysm, dissection, vasculitis or  significant stenosis. SMA: Patent without evidence of aneurysm, dissection, vasculitis or significant stenosis. Renals: Accessory renal artery on the left. The renal arteries are patent without evidence of aneurysm, dissection, vasculitis, fibromuscular dysplasia or significant stenosis. IMA: Patent without evidence of aneurysm, dissection, vasculitis or significant stenosis. Inflow: Patent without evidence of aneurysm, dissection, vasculitis or significant stenosis.Generous atherosclerotic calcification. Veins: No obvious venous abnormality within the limitations of this arterial phase study. Review of the MIP images confirms the above findings. NON-VASCULAR Hepatobiliary: No focal liver abnormality is seen. Status post cholecystectomy. No biliary dilatation. Pancreas: Unremarkable. No pancreatic ductal dilatation or surrounding inflammatory changes. Spleen: Normal in size without focal abnormality. Adrenals/Urinary Tract: Adrenal glands are unremarkable. Kidneys are normal, without renal calculi, focal lesion, or hydronephrosis. Bladder is unremarkable. Stomach/Bowel: Hiatal hernia. Stomach and small bowel appear otherwise unremarkable. Colon is unremarkable. Lymphatic: No pathologic adenopathy in the abdomen or pelvis. Reproductive: Status  post hysterectomy. No adnexal masses. Other: No acute findings.  No ascites. Musculoskeletal: No significant skeletal lesion. Review of the MIP images confirms the above findings. IMPRESSION: 1. Normal caliber aorta with moderate atherosclerotic plaque. No evidence of dissection, aneurysm or significant stenosis. 2. No acute findings are evident in the chest, abdomen or pelvis. 3. Hiatal hernia. Electronically Signed   By: Andreas Newport M.D.   On: 01/22/2017 03:56    EKG: Independently reviewed. Rate 72, paced.  Echocardiogram June 2017: EF 45-50% Grade I DD        Assessment/Plan  1. Back pain and chest pain:  The differential at this point includes  epidural spinal abscess, pericarditis, less likely muscle spasm (would not explain heme findings, lactate, unless these were incidental).   -Obtain blood cultures -MRI C and T spine with and without contrast ordered -Echocardiogram ordered -Trend troponin -Continue home Norco, morphine for severe pain   2. Anemia, thrombocytopenia and leukocytosis:  Leukemia is within the differential esp given reported smear at OSH, versus infection with myelosuppression (either from infection, less likely recent doxycycline).   -Trend CBC -Consult to Oncology  3. Hypothyroidism:  -Continue levothyroxine  4. Hypertension:  -Continue lisinopril  5. Chronic pain:  -Continue hydrocodone  6. Other medications:  -Continue allopurinol          DVT prophylaxis: SCDs  Code Status: FULL  Family Communication: Daughter at bedside  Disposition Plan: Anticipate MRI and echocardiogram today, repeat CBC tomorrow.   Consults called: None overnight, EDP spoke with Cardiology, who have not yet evaluated the patient Admission status: OBS At the point of initial evaluation, it is my clinical opinion that admission for OBSERVATION is reasonable and necessary because the patient's presenting complaints in the context of their chronic conditions represent sufficient risk of deterioration or significant morbidity to constitute reasonable grounds for close observation in the hospital setting, but that the patient may be medically stable for discharge from the hospital within 24 to 48 hours.    Medical decision making: Patient seen at 6:11 AM on 01/22/2017.  The patient was discussed with Dr. Leonides Schanz.  What exists of the patient's chart was reviewed in depth and outside records were requested from [facility] (discharge summary not yet completed from there) and summarized above.  Clinical condition: stable.        Edwin Dada [facility] Pager [phone]

## 2017-01-22 NOTE — Progress Notes (Signed)
Pt seen and examined at bedside, please see earlier admission note by Dr. Loleta Books. In brief:  HPI: Pt is 76 y.o. female with known CAD, type 2 mobitz with pacer, HTN, thryoid CA s/p thyroidectomy, OSA on CPAP, who presented with 1 week duration of intermittent mid area chest pain that is occasionally but not consistently radiating to her right shoulder, 7/10 in severity when present and associated with upper area back pain. No specific alleviating factors but occasionally worse with movement. Pt went to [facility] 4 days prior to this admission (PTA) for this, was admitted, underwent LHC that showed an affected diagonal artery only, had a echo that was normal, had carotids that were normal, and was discharged one day PTA without a clear diagnosis. Cardiology was consulted here at [facility] for assistance as troponins remains mildly elevated. Telemetry bed requested.  Assessment and Plan:  1. Back pain and chest pain:  - unclear etiology at this time  - MRI C and T spine with and without contrast ordered - cardiology consulted for assistance   2. Anemia, thrombocytopenia and leukocytosis:  - Leukemia is in differential esp given reported smear at OSH, vs infection with myelosuppression (? from infection, less likely recent doxycycline).   - repeat CBC in AM  3. Hypothyroidism:  - Continue levothyroxine  4. Hypertension:  - Continue lisinopril  5. Chronic pain:  - Continue hydrocodone  Faye Ramsay, MD  [facility] Pager [phone]  If 7PM-7AM, please contact night-coverage [email or web address] Password [email or web address]

## 2017-01-22 NOTE — ED Triage Notes (Signed)
Pt was seen at [facility] for cp on Monday. Has full cardiac workup per daughter with negative results. Pt just discharged 1/31 at 3pm. Chest pain starts mid sternal radiating to both arms and into back. Also reports nausea

## 2017-01-22 NOTE — ED Notes (Signed)
Admitting MD at bedside.

## 2017-01-22 NOTE — ED Provider Notes (Signed)
By signing my name below, I, Charolotte Eke, attest that this documentation has been prepared under the direction and in the presence of Ina, DO. Electronically Signed: Charolotte Eke, Scribe. 01/22/17. 2:04 AM.  TIME SEEN: 1:53 AM  CHIEF COMPLAINT: chest pain  HPI: Sharon Pineda is a 76 y.o. 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 She has been recently discharged from the hospital in [place], [place], and had a cardiac catheterization. Dr Harl Bowie in [place] is normal cardiologist. Pt has associated N/V, SOB. Pt has not eaten anything due to nausea. Pt denies fever, coughing. Pt denies any tearing, ripping sensations. Pt is on doxycycline for recent sinus infection. States she did get a steroid injection 2 weeks ago when she was diagnosed with sinusitis by her PCP. No other steroids. Patient states she had a 70% blockage in her first diagonal but had no stent placed. Catheterization was performed through her right femoral artery.  ROS: See HPI Constitutional: no fever  Eyes: no drainage  ENT: no runny nose   Cardiovascular: chest pain  Resp: SOB  GI: vomiting GU: no dysuria Integumentary: no rash  Allergy: no hives  Musculoskeletal: no leg swelling  Neurological: no slurred speech ROS otherwise negative  PAST MEDICAL HISTORY/PAST SURGICAL HISTORY:  Past Medical History:  Diagnosis Date  . DDD (degenerative disc disease), thoracic   . Hyperlipidemia   . Hypertension   . Kidney stones   . Mobitz II   . Thyroid cancer ([place])    a. s/p thyroidecetomy    MEDICATIONS:  Prior to Admission medications   Medication Sig Start Date End Date Taking? Authorizing Provider  albuterol (PROVENTIL HFA;VENTOLIN HFA) 108 (90 Base) MCG/ACT inhaler  Inhale 1 puff into the lungs every 6 (six) hours as needed for wheezing or shortness of breath.    Historical Provider, MD  allopurinol (ZYLOPRIM) 100 MG tablet Take 100 mg by mouth 2 (two) times daily.    Historical Provider, MD  levothyroxine (SYNTHROID, LEVOTHROID) 88 MCG tablet Take 88 mcg by mouth daily before breakfast.    Historical Provider, MD  lisinopril (PRINIVIL,ZESTRIL) 10 MG tablet Take 10 mg by mouth daily.    Historical Provider, MD    ALLERGIES:  Allergies  Allergen Reactions  . Dilaudid [Hydromorphone Hcl] Anaphylaxis  . Penicillins Swelling    Has patient had a PCN reaction causing immediate rash, facial/tongue/throat swelling, SOB or lightheadedness with hypotension:  Has patient had a PCN reaction causing severe rash involving mucus membranes or skin necrosis:  Has patient had a PCN reaction that required hospitalization  Has patient had a PCN reaction occurring within the last 10 years:  If all of the above answers are "NO", then may proceed with Cephalosporin use.  . Sulfa Antibiotics Swelling  . Tizanidine Hcl Anxiety    SOCIAL HISTORY:  Social History  Substance Use Topics  . Smoking status: Never Smoker  . Smokeless tobacco: Never Used  . Alcohol use No    FAMILY HISTORY: Family History  Problem Relation Age of Onset  . Hypertension Mother     EXAM: BP 142/67 (BP Location: Right Arm)   Pulse 71   Temp 98.2 F (36.8 C) (Oral)   Resp 18   SpO2 96%  CONSTITUTIONAL: Alert and oriented  and responds appropriately to questions. Well-appearing; well-nourished, elderly, appears uncomfortable HEAD: Normocephalic EYES: Conjunctivae clear, PERRL, EOMI ENT: normal nose; no rhinorrhea; moist mucous membranes NECK: Supple, no meningismus, no nuchal rigidity, no LAD  CARD: RRR; S1 and S2 appreciated; no murmurs, no clicks, no rubs, no gallops RESP: Normal chest excursion without splinting or tachypnea; breath sounds clear and equal bilaterally; no wheezes, no  rhonchi, no rales, no hypoxia or respiratory distress, speaking full sentences ABD/GI: Normal bowel sounds; non-distended; soft, non-tender, no rebound, no guarding, no peritoneal signs, no hepatosplenomegaly; right femoral artery has strong pulse without ecchymosis, erythema or warmth, no sign of swelling or significant tenderness in this area BACK:  The back appears normal and is non-tender to palpation, there is no CVA tenderness EXT: Normal ROM in all joints; non-tender to palpation; no edema; normal capillary refill; no cyanosis, no calf tenderness or swelling; extremities are warm and well-perfused SKIN: Normal color for age and race; warm; no rash NEURO: Moves all extremities equally, sensation to light touch intact diffusely, cranial nerves II through XII intact, normal speech PSYCH: The patient's mood and manner are appropriate. Grooming and personal hygiene are appropriate.  MEDICAL DECISION MAKING: Patient here with chest pain. Workup shows significant leukocytosis without obvious cause. Denies being on steroids. Denies fevers, cough. Chest x-ray shows no pneumonia. On doxycycline for recent sinus infection 2 weeks ago. Will obtain urinalysis, lactate and rectal temperature. Had cardiac catheterization, echocardiogram and a CT scan of her chest performed in [facility]. Was just discharged from the hospital yesterday morning. She did have 70% blockage of her first diagonal per patient's daughter. We will attempt to contact the outside hospital for records. Unfortunately our radiologist does not have access to these images. I feel she may need a CT scan to rule out dissection. She is aware that this would be repeat contrasted repeat radiation but if we're unable to get outside hospital records quickly, I feel that this may need to be done soon. We'll give GI cocktail in case any of this is esophagitis, gastric in nature. She reports morphine and hydrocodone about out-of-hospital did not  help her pain much. First troponin here is negative.  ED PROGRESS: Patient's CT scan shows no dissection, aneurysm or other acute abnormality. Lactate mildly elevated. She is receiving IV hydration. Rectal temperature 99.4. Urine shows blood and few bacteria but no obvious sign of infection. Plan is to discuss with cardiology on call for their recommendations. Patient's pain control after one dose of morphine. She appears much more comfortable.    4:40 AM  D/w Dr. Koleen Nimrod, on call for cardiology.  Appreciate his help.  He does not feel that this 70% ostial lesion in the first diagonal is the cause of her pain. It appears per her cath report she also had 50% ostial disease in the second diagonal and a 30% proximal disease and mid vessel disease in the RCA. Echocardiogram showed EF of 60-65% with mild aortic regurgitation. She also had carotid Dopplers which showed less than 50% stenosis bilaterally. He recommends adding on an ESR and CRP in case this is pericarditis. We'll also add on blood cultures, differential. We'll discuss with medicine.   5:00 AM  D/w Dr. Loleta Books with hospitalist service. Appreciate his help. He recommends adding on a lipase, agrees with blood cultures. Discussed with him and he feels that this could be primary spine issue such as discitis, epidural abscess. On reevaluation patient does not have any midline spinal tenderness, step-off or  deformity. She is more tender to palpation over the right thoracic paraspinal musculature without erythema, warmth, fluctuance, induration or other lesions noted to the skin. She denies any numbness, tingling or focal weakness. Able to ambulate without difficulty. No bowel or bladder incontinence.   Plan currently is to hold on broad-spectrum antibiotics as patient does not meet SIRs criteria except for her leukocytosis.   6:00 AM  Dr. Loleta Books has seen patient and plans to admit. It appears on admission on 01/19/17 her leukocytosis was 11.9,  hemoglobin 11.5, platelets 111. Received outside hospital discharge summary. It appears patient had a leukocytosis on discharge but it is unclear how high. She had a peripheral smear by pathologist that was concerning for acute leukemia was referred to hematology/oncology. Dr. Loleta Books aware of this as well. Second troponin is elevated. Not complaining of chest pain at this time, mostly back pain.    I reviewed all nursing notes, vitals, pertinent old records, EKGs, labs, imaging (as available).     EKG Interpretation  Date/Time:  Thursday January 22 2017 00:46:50 EST Ventricular Rate:  72 PR Interval:  188 QRS Duration: 144 QT Interval:  426 QTC Calculation: 466 R Axis:   -65 Text Interpretation:  Atrial-sensed ventricular-paced rhythm Abnormal ECG Confirmed by Dawnn Nam,  DO, Ashlynne Shetterly ([id number]) on 01/22/2017 1:24:37 AM      I personally performed the services described in this documentation, which was scribed in my presence. The recorded information has been reviewed and is accurate.     [place], DO 01/22/17 [phone]

## 2017-01-22 NOTE — ED Notes (Signed)
Istat Troponin .011 dr ward aware

## 2017-01-22 NOTE — ED Notes (Signed)
Pt walked to bathroom and back. PT tolerated it well no complaints.

## 2017-01-22 NOTE — ED Notes (Signed)
Notified CT tech that Dr. Leonides Schanz is requesting pt be brought to CT STAT for suspicion of aortic dissection. CT tech states they will send for her soon.

## 2017-01-22 NOTE — Progress Notes (Signed)
Confirmed with MRI that pt is okay to have MRI with pacemaker and total knee replacement. Confirmed an RN will have to be present with pt at MRI during whole time she is there. Pt planned to have MRI at 1730. Will continue to monitor.

## 2017-01-23 ENCOUNTER — Ambulatory Visit (HOSPITAL_BASED_OUTPATIENT_CLINIC_OR_DEPARTMENT_OTHER): Payer: Medicare PPO

## 2017-01-23 DIAGNOSIS — R748 Abnormal levels of other serum enzymes: Secondary | ICD-10-CM | POA: Diagnosis not present

## 2017-01-23 DIAGNOSIS — I10 Essential (primary) hypertension: Secondary | ICD-10-CM

## 2017-01-23 DIAGNOSIS — R079 Chest pain, unspecified: Secondary | ICD-10-CM | POA: Diagnosis not present

## 2017-01-23 DIAGNOSIS — M546 Pain in thoracic spine: Secondary | ICD-10-CM | POA: Diagnosis not present

## 2017-01-23 DIAGNOSIS — I2510 Atherosclerotic heart disease of native coronary artery without angina pectoris: Secondary | ICD-10-CM | POA: Diagnosis not present

## 2017-01-23 DIAGNOSIS — R072 Precordial pain: Secondary | ICD-10-CM | POA: Diagnosis not present

## 2017-01-23 LAB — COMPREHENSIVE METABOLIC PANEL
ALT: 13 U/L — AB (ref 14–54)
AST: 18 U/L (ref 15–41)
Albumin: 2.8 g/dL — ABNORMAL LOW (ref 3.5–5.0)
Alkaline Phosphatase: 51 U/L (ref 38–126)
Anion gap: 8 (ref 5–15)
BUN: 12 mg/dL (ref 6–20)
CHLORIDE: 102 mmol/L (ref 101–111)
CO2: 26 mmol/L (ref 22–32)
CREATININE: 0.9 mg/dL (ref 0.44–1.00)
Calcium: 8.2 mg/dL — ABNORMAL LOW (ref 8.9–10.3)
Glucose, Bld: 141 mg/dL — ABNORMAL HIGH (ref 65–99)
POTASSIUM: 4 mmol/L (ref 3.5–5.1)
SODIUM: 136 mmol/L (ref 135–145)
Total Bilirubin: 0.7 mg/dL (ref 0.3–1.2)
Total Protein: 5.8 g/dL — ABNORMAL LOW (ref 6.5–8.1)

## 2017-01-23 LAB — CBC
HCT: 28.3 % — ABNORMAL LOW (ref 36.0–46.0)
Hemoglobin: 9.3 g/dL — ABNORMAL LOW (ref 12.0–15.0)
MCH: 31.6 pg (ref 26.0–34.0)
MCHC: 32.9 g/dL (ref 30.0–36.0)
MCV: 96.3 fL (ref 78.0–100.0)
PLATELETS: 68 10*3/uL — AB (ref 150–400)
RBC: 2.94 MIL/uL — AB (ref 3.87–5.11)
RDW: 18.9 % — ABNORMAL HIGH (ref 11.5–15.5)
WBC: 29.3 10*3/uL — AB (ref 4.0–10.5)

## 2017-01-23 LAB — ECHOCARDIOGRAM COMPLETE
HEIGHTINCHES: 66 in
WEIGHTICAEL: 2932.8 [oz_av]

## 2017-01-23 MED ORDER — PERFLUTREN LIPID MICROSPHERE
1.0000 mL | INTRAVENOUS | Status: AC | PRN
  Administered 2017-01-23: 2 mL via INTRAVENOUS
  Filled 2017-01-23: qty 10

## 2017-01-23 NOTE — Consult Note (Signed)
Cardiology Consult Note  Date: 01/23/2017               Patient Name:  Sharon Pineda MRN: [id number]  DOB: [birth year] Age / Sex: 76 y.o., female   PCP: Lanier Clam, MD         Chief Complaint: chest pain  History of Present Illness: Patient is a 76 year old female with a past medical history of coronary artery disease, hypertension, hyperlipidemia, pacemaker secondary to [facility] type II AV block, thyroid cancer status post thyroidectomy, OSA on CPAP, fibromyalgia who presented to [facility] ED on 01/22/2017 with a 6 day history of chest pain and back pain. Patient reports having acute onset chest pain at rest on 01/16/17 after she woke up from her sleep and was still lying down in the bed. She describes the chest pain has "squeezing, throbbing, burning" in character. States she was having pain on both sides of her chest which radiated to her entire back, both shoulders, and both arms. States her chest and back pain have persisted since then and usually range from 4-6 in severity. Pain is worse when she changes position in bed. Does report having nausea and occasional shortness of breath. Reports having fatigue and night sweats for the past few months. Denies having any fevers, chills, orthopnea, or palpitations. States she went to [facility] on 01/20/2016 and did not get a clear diagnosis. Does report having chronic back pain due to degenerative disc disease. States she had back surgery done in February 2017 and takes hydrocodone at home for pain. Dr. Harl Bowie in [place] is her cardiologist.  Patient was recently admitted to [facility] and had cardiac catheterization, echo, and carotid dopplers done during that hospitalization. She was discharged on 01/21/17 without a clear diagnosis. Per ED provider review of discharge summary, patient's white count was 11.9, hemoglobin 11.5, platelets 111 during that hospitalization. Peripheral smear reviewed by pathologist during  that hospitalization was concerning for acute leukemia and patient was referred to hematology/ oncology. Per ED provider documentation, patient has a 70% ostial lesion in the first diagonal, 50% ostial disease in the second diagonal, 30% proximal disease, and mid vessel disease in the RCA. Echo showed EF of 60-65% with mild aortic regurgitation. Carotid Doppler showed less than 50% stenosis bilaterally.    Initial labs during current hospitalization showing white count 27,000, hemoglobin 10.5, and platelets 79. Lactic acid 2.16>1.19. CT angio of chest/ abdomen/ pelvis negative for aortic dissection. Troponins peaked at 0.20. EKG w/o acute ischemic changes.   Meds:  Active Medications      Current Meds  Medication Sig  . albuterol (PROVENTIL HFA;VENTOLIN HFA) 108 (90 Base) MCG/ACT inhaler Inhale 1 puff into the lungs every 6 (six) hours as needed for wheezing or shortness of breath.  . allopurinol (ZYLOPRIM) 100 MG tablet Take 100 mg by mouth 2 (two) times daily.  Marland Kitchen HYDROcodone-acetaminophen (NORCO/VICODIN) 5-325 MG tablet Take 1 tablet by mouth every 6 (six) hours as needed for moderate pain.  Marland Kitchen levothyroxine (SYNTHROID, LEVOTHROID) 88 MCG tablet Take 88 mcg by mouth daily before breakfast.  . lisinopril (PRINIVIL,ZESTRIL) 10 MG tablet Take 10 mg by mouth daily.       Allergies:      Allergies as of 01/22/2017 - Review Complete 01/22/2017  Allergen Reaction Noted  . Dilaudid [hydromorphone hcl] Anaphylaxis 01/25/2016  . Penicillins Swelling 01/25/2016  . Sulfa antibiotics Swelling 01/25/2016  . Tizanidine hcl Anxiety 01/25/2016       Past Medical History:  Diagnosis  Date  . DDD (degenerative disc disease), thoracic   . Hyperlipidemia   . Hypertension   . Kidney stones   . Mobitz II   . Thyroid cancer ([place])    a. s/p thyroidecetomy    Family History: Mother with hypertension and lung disease. Father with hypertension and stomach cancer.  Social History: No  ethanol, tobacco, or illicit drug use.  Review of Systems: A complete ROS was negative except as per HPI.  Physical Exam: Blood pressure (!) 103/45, pulse 80, temperature 98.9 F (37.2 C), temperature source Oral, resp. rate 20, height 5' 6"  (1.676 m), weight 83.1 kg (183 lb 4.8 oz), SpO2 94 %. Physical Exam  Constitutional: She is oriented to person, place, and time. She appears well-developed and well-nourished. No distress.  HENT:  Head: Normocephalic and atraumatic.  Eyes: EOM are normal.  Cardiovascular: Normal rate, regular rhythm and intact distal pulses.   No peripheral edema.  Pulmonary/Chest: Effort normal and breath sounds normal. No respiratory distress. She has no wheezes. She has no rales.  Abdominal: Soft. Bowel sounds are normal. She exhibits no distension. There is no tenderness.  Musculoskeletal: She exhibits no deformity.  Neurological: She is alert and oriented to person, place, and time.  Skin: Skin is warm and dry.    EKG: Paced rhythm. No acute ST/T wave changes.   CXR (01/22/17): Minimal left basilar atelectasis noted. Lungs otherwise clear.    Assessment & Plan by Problem: Principal Problem:   Back pain Active Problems:   Hypothyroidism   HTN (hypertension)   Pain in the chest   Second degree Mobitz II AV block   Normocytic anemia   Thrombocytopenia (HCC)   Leukocytosis   Atypical chest pain Troponins 0.11>0.19>0.20>0.18.  EKG without acute ischemic changes. Recent cath (per ED provider note) showing a 70% ostial lesion in the first diagonal, 50% ostial disease in the second diagonal, 30% proximal disease, and mid vessel disease in the RCA. These findings are less likely to cause resting chest pain. Pharmacologic stress test done on 01/26/2016 was low risk. ACS less likely as she has been having persistent chest pain for a week. Her ESR and CRP came back elevated concerning for possible pericarditis. Back pain could also possibly be  contributing to her symptoms. MRI of cervical and thoracic spine done 01/22/2017 showing mild cervical spondylosis and central disc protrusion at the level of the thoracic spine.  -Echo to r/o pericarditis. If echo is negative for pericarditis, consider ordering a cardiac MRI.  -Consider starting NSAIDs and colchicine -Continue Norco prn back pain  Hypertension: Stable.  -Continue home lisinopril   Anemia, thrombocytopenia, and leukocytosis Peripheral smear done during recent hospitalization was concerning for acute leukemia. Today's labs showing worsening leukocytosis, anemia, and thrombocytopenia. -Continue to trend CBC -Hematology/ oncology consult pending   Signed: Shela Leff, MD 01/23/2017, 12:00 PM  Pager: [phone]   I saw and evaluated the patient along with Dr. Marlowe Sax - R-2. We discussed the patient's chart and findings on examination as well as history. I agree with the summation above as per our discussion.  Very difficult scenario with the patient and his had an echocardiogram and a cardiac catheterization done in outside hospital. Prolonged/protracted chest pain does not come from a 70% ostial diagonal lesion which was all that was noted on cardiac catheterization. Have not seen her her the echocardiogram report which is important as one potential her chest discomfort being possibly pericarditis (mild troponin elevation and elevated CRP).   Could consider  the potential treating pericarditis, but I would like to see the results of the echocardiogram first -- this would be collapsing in NSAIDs.  Her leukocytosis suggests possible malignancy/leukemia, however cannot exclude a viral inflammatory response versus hematologic disorder. She does have a significant increased CRP and ESR which would go along with pericarditis, but also another inflammatory process.  I don't think her chest pain is ACS related, and the troponin elevation is only concerning nature which has a flat  trend pattern it does not go along with ACS. It seems like more potentially related to myopericarditis.  We will follow-up echocardiogram and provide further insight tomorrow.    Glenetta Hew, M.D., M.S. Interventional Cardiologist   Pager # [phone] Phone # [phone] [address] [place], [place] [address]

## 2017-01-23 NOTE — Progress Notes (Signed)
  Echocardiogram 2D Echocardiogram has been performed.  Jennette Dubin 01/23/2017, 3:29 PM

## 2017-01-23 NOTE — Evaluation (Signed)
Occupational Therapy Evaluation Patient Details Name: Sharon Pineda MRN: [id number] DOB: 04-Mar-1941 Today's Date: 01/23/2017    History of Present Illness Pt is 76 y.o. female with known CAD, type 2 mobitz with pacer, HTN, thryoid CA s/p thyroidectomy, OSA on CPAP, who presented with 1 week duration of intermittent mid area chest pain that is occasionally but not consistently radiating to her right shoulder, 7/10 in severity when present and associated with upper area back pain. No specific alleviating factors but occasionally worse with movement. Pt went to [facility] 4 days prior to this admission (PTA) for this, was admitted, underwent LHC that showed an affected diagonal artery only, had a echo that was normal, had carotids that were normal, and was discharged one day PTA without a clear diagnosis. Cardiology was consulted here at [facility] for assistance as troponins remains mildly elevated.   Clinical Impression   Pt admitted with the above diagnoses and presents with below problem list. Pt will benefit from continued acute OT to address the below listed deficits and maximize independence with basic ADLs prior to d/c home. Prior to onset of recent symptoms pt was independent with ADLs, drove, and was out in the community. Pt currently setup to min A with ADLs. Pt lives with son who is disabled, daughter and son-in-law live next door. Educated on positioning, movements to avoid for comfort (no BAT at back), and home setup.       Follow Up Recommendations  No OT follow up    Equipment Recommendations  None recommended by OT    Recommendations for Other Services PT consult     Precautions / Restrictions Restrictions Weight Bearing Restrictions: No      Mobility Bed Mobility Overal bed mobility: Modified Independent                Transfers Overall transfer level: Needs assistance Equipment used: None;Rolling walker (2 wheeled) Transfers: Sit to/from Stand Sit to  Stand: Supervision;Min guard         General transfer comment: from EOB and regular height toilet. discussed 3n1 over toilet at home for comfort with elevated surface and arm bars.    Balance Overall balance assessment: Needs assistance Sitting-balance support: No upper extremity supported;Feet supported Sitting balance-Leahy Scale: Good     Standing balance support: No upper extremity supported Standing balance-Leahy Scale: Good Standing balance comment: stood at sink to complete oral care                            ADL Overall ADL's : Needs assistance/impaired Eating/Feeding: Set up;Sitting   Grooming: Oral care;Min guard;Standing Grooming Details (indicate cue type and reason): likely would need to sit for longer grooming session Upper Body Bathing: Set up;Sitting   Lower Body Bathing: Min guard;Minimal assistance;Sit to/from stand   Upper Body Dressing : Set up   Lower Body Dressing: Min guard;Minimal assistance;Sit to/from stand Lower Body Dressing Details (indicate cue type and reason): has reacher at home, instructed in use for LB dressing for comfort Toilet Transfer: Min guard;Ambulation   Toileting- Clothing Manipulation and Hygiene: Min guard;Sit to/from stand   Tub/ Shower Transfer: Walk-in shower;Min guard;Ambulation;Shower seat   Functional mobility during ADLs: Min guard General ADL Comments: Pt completed bed mobility, oral care at sink, and toilet transfer. Trialed rw for comfort with back pain , pt reporting it did not help this session. Walked back to bed without rw min guard for safety.  Vision     Perception     Praxis      Pertinent Vitals/Pain Pain Assessment: Faces Faces Pain Scale: Hurts even more Pain Location: back and left arm; worse with movement/OBB, improved somewhat resting in sidelying Pain Descriptors / Indicators: Grimacing;Constant Pain Intervention(s): Limited activity within patient's tolerance;Monitored during  session;Premedicated before session;Repositioned     Hand Dominance     Extremity/Trunk Assessment Upper Extremity Assessment Upper Extremity Assessment: Overall WFL for tasks assessed   Lower Extremity Assessment Lower Extremity Assessment: Defer to PT evaluation       Communication Communication Communication: No difficulties   Cognition Arousal/Alertness: Awake/alert Behavior During Therapy: WFL for tasks assessed/performed Overall Cognitive Status: Within Functional Limits for tasks assessed                     General Comments       Exercises       Shoulder Instructions      Home Living Family/patient expects to be discharged to:: Private residence Living Arrangements: Children Available Help at Discharge: Family Type of Home: House Home Access: Stairs to enter Technical brewer of Steps: 2 Entrance Stairs-Rails: Right;Left;Can reach both Home Layout: Two level;Able to live on main level with bedroom/bathroom     Bathroom Shower/Tub: Occupational psychologist: Standard     Home Equipment: Bedside commode;Shower seat;Cane - single point;Walker - 2 wheels;Wheelchair - Scientist, physiological: Reacher Additional Comments: Pt lives with adult son who is disabled (CHF, etc). Adult daughter and son-in-law live next door.       Prior Functioning/Environment Level of Independence: Independent        Comments: drives, does grocery shopping before onset of symptoms        OT Problem List: Impaired balance (sitting and/or standing);Decreased knowledge of use of DME or AE;Decreased knowledge of precautions;Pain   OT Treatment/Interventions: Self-care/ADL training;DME and/or AE instruction;Therapeutic activities;Patient/family education;Balance training    OT Goals(Current goals can be found in the care plan section) Acute Rehab OT Goals Patient Stated Goal: decreased pain OT Goal Formulation: With  patient/family Time For Goal Achievement: 02/06/17 Potential to Achieve Goals: Good ADL Goals Pt Will Perform Lower Body Bathing: with modified independence;with adaptive equipment;sit to/from stand Pt Will Perform Lower Body Dressing: with modified independence;with adaptive equipment;sit to/from stand Pt Will Transfer to Toilet: with modified independence;ambulating (3n1 over toilet) Pt Will Perform Toileting - Clothing Manipulation and hygiene: with modified independence;sit to/from stand;sitting/lateral leans;with adaptive equipment Pt Will Perform Tub/Shower Transfer: Shower transfer;ambulating;shower seat  OT Frequency: Min 2X/week   Barriers to D/C:            Co-evaluation              End of Session Equipment Utilized During Treatment: Rolling walker;Other (comment) (used for part of session)  Activity Tolerance: Patient tolerated treatment well;Patient limited by pain Patient left: in bed;with call bell/phone within reach;with family/visitor present   Time: 1003-1025 OT Time Calculation (min): 22 min Charges:  OT General Charges $OT Visit: 1 Procedure OT Evaluation $OT Eval Low Complexity: 1 Procedure G-Codes: OT G-codes **NOT FOR INPATIENT CLASS** Functional Assessment Tool Used: clinical judgement Functional Limitation: Self care Self Care Current Status [id number]): At least 1 percent but less than 20 percent impaired, limited or restricted Self Care Goal Status [id number]): At least 1 percent but less than 20 percent impaired, limited or restricted  Hortencia Pilar 01/23/2017, 10:51 AM

## 2017-01-23 NOTE — Progress Notes (Signed)
Physical Therapy Evaluation Patient Details Name: Sharon Pineda MRN: [id number] DOB: [birth year] Today's Date: 01/23/2017   History of Present Illness  Pt is 76 y.o. female with known CAD, type 2 mobitz with pacer, HTN, thryoid CA s/p thyroidectomy, OSA on CPAP, who presented with 1 week duration of intermittent mid area chest pain that is occasionally but not consistently radiating to her right shoulder, 7/10 in severity when present and associated with upper area back pain. No specific alleviating factors but occasionally worse with movement. Pt went to [facility] 4 days prior to this admission (PTA) for this, was admitted, underwent LHC that showed an affected diagonal artery only, had a echo that was normal, had carotids that were normal, and was discharged one day PTA without a clear diagnosis. Cardiology was consulted here at [facility] for assistance as troponins remains mildly elevated.  Clinical Impression  Patient presents with significant pain into her chest and arms. She has some tenderness to palpation R> L but no significant reproduction of pain with palpation. She has pain when rotating her head and ambulation increased her pain. The burning pain into her chest and arms make a strictly musculo skeletal cause questionable. If it is determined that her pain is coming from just her neck she would benefit from skilled therapy for postural correction and manual therapy. She would also benefit from further skilled therapy at an outpatient clinic when the pain origin is identified.     Follow Up Recommendations Outpatient PT    Equipment Recommendations       Recommendations for Other Services       Precautions / Restrictions Precautions Precautions: Fall Restrictions Weight Bearing Restrictions: No      Mobility  Bed Mobility Overal bed mobility: Modified Independent                Transfers Overall transfer level: Needs assistance Equipment used: None;Rolling walker (2  wheeled) Transfers: Sit to/from Stand Sit to Stand: Supervision;Min guard         General transfer comment: cuing provided for log roll. Pain with transfer.   Ambulation/Gait Ambulation/Gait assistance: Supervision Ambulation Distance (Feet): 40 Feet Assistive device: None       General Gait Details: Increased pain with gait. Patient trying to keep her head still as much as possible.   Stairs            Wheelchair Mobility    Modified Rankin (Stroke Patients Only)       Balance Overall balance assessment: Needs assistance Sitting-balance support: No upper extremity supported;Feet supported Sitting balance-Leahy Scale: Good     Standing balance support: No upper extremity supported Standing balance-Leahy Scale: Good Standing balance comment: stood at sink to complete oral care                             Pertinent Vitals/Pain Pain Assessment: No/denies pain Faces Pain Scale: Hurts worst Pain Location: back and left arm; worse with movement/OBB, improved somewhat resting in sidelying Pain Descriptors / Indicators: Grimacing;Constant Pain Intervention(s): Limited activity within patient's tolerance    Home Living Family/patient expects to be discharged to:: Private residence Living Arrangements: Children Available Help at Discharge: Family Type of Home: House Home Access: Stairs to enter Entrance Stairs-Rails: Right;Left;Can reach both Entrance Stairs-Number of Steps: 2 Home Layout: Two level;Able to live on main level with bedroom/bathroom Home Equipment: Bedside commode;Shower seat;Cane - single point;Walker - 2 wheels;Wheelchair - Insurance risk surveyor Comments: Pt  lives with adult son who is disabled (CHF, etc). Adult daughter and son-in-law live next door.     Prior Function Level of Independence: Independent         Comments: drives, does grocery shopping before onset of symptoms     Hand Dominance         Extremity/Trunk Assessment   Upper Extremity Assessment Upper Extremity Assessment: Overall WFL for tasks assessed    Lower Extremity Assessment Lower Extremity Assessment: Overall WFL for tasks assessed    Cervical / Trunk Assessment Cervical / Trunk Assessment:  (limited cervical rotation 2nd to pain )  Communication   Communication: No difficulties  Cognition Arousal/Alertness: Awake/alert Behavior During Therapy: WFL for tasks assessed/performed Overall Cognitive Status: Within Functional Limits for tasks assessed                      General Comments      Exercises     Assessment/Plan    PT Assessment Patient needs continued PT services  PT Problem List Decreased range of motion;Decreased activity tolerance;Decreased mobility;Pain          PT Treatment Interventions Functional mobility training;Therapeutic activities;Therapeutic exercise;Manual techniques;Modalities (postural education )    PT Goals (Current goals can be found in the Care Plan section)  Acute Rehab PT Goals Patient Stated Goal: decreased pain PT Goal Formulation: With patient Time For Goal Achievement: 02/06/17 Potential to Achieve Goals: Good    Frequency Min 3X/week   Barriers to discharge        Co-evaluation               End of Session Equipment Utilized During Treatment: Gait belt Activity Tolerance: Patient tolerated treatment well Patient left: in bed;with family/visitor present;with nursing/sitter in room Nurse Communication: Mobility status         Time: [id number] PT Time Calculation (min) (ACUTE ONLY): 23 min   Charges:   PT Evaluation $PT Eval Moderate Complexity: 1 Procedure     PT G Codes:        Carney Living PT DPT  01/23/2017, 3:52 PM

## 2017-01-23 NOTE — Progress Notes (Signed)
Pt slept most of the time during the time, given pain medicine twice for the complain of back pain, vitals stable, no any other specific complain, MRI done, RN was present during the procedure done at MRI, will continue to monitor the patient.

## 2017-01-23 NOTE — H&P (Deleted)
Cardiology Consult Note  Date: 01/23/2017               Patient Name:  Sharon Pineda MRN: [id number]  DOB: 04/09/1941 Age / Sex: 75 y.o., female   PCP: Shyam E Balakrishnan, MD         Chief Complaint: chest pain  History of Present Illness: Patient is a 75-year-old female with a past medical history of coronary artery disease, hypertension, hyperlipidemia, pacemaker secondary to [place] type II AV block, thyroid cancer status post thyroidectomy, OSA on CPAP, fibromyalgia who presented to [place] [facility] on 01/22/2017 with a 6 day history of chest pain and back pain. Patient reports having acute onset chest pain at rest on 01/16/17 after she woke up from her sleep and was still lying down in the bed. She describes the chest pain has "squeezing, throbbing, burning" in character. States she was having pain on both sides of her chest which radiated to her entire back, both shoulders, and both arms. States her chest and back pain have persisted since then and usually range from 4-6 in severity. Pain is worse when she changes position in bed. Does report having nausea and occasional shortness of breath. Reports having fatigue and night sweats for the past few months. Denies having any fevers, chills, orthopnea, or palpitations. States she went to [facility] on 01/20/2016 and did not get a clear diagnosis. Does report having chronic back pain due to degenerative disc disease. States she had back surgery done in February 2017 and takes hydrocodone at home for pain. Dr. Branch in [place] is her cardiologist.  Patient was recently admitted to [facility] and had cardiac catheterization, echo, and carotid dopplers done during that hospitalization. She was discharged on 01/21/17 without a clear diagnosis. Per ED provider review of discharge summary, patient's white count was 11.9, hemoglobin 11.5, platelets 111 during that hospitalization. Peripheral smear reviewed by pathologist during that  hospitalization was concerning for acute leukemia and patient was referred to hematology/ oncology. Per ED provider documentation, patient has a 70% ostial lesion in the first diagonal, 50% ostial disease in the second diagonal, 30% proximal disease, and mid vessel disease in the RCA. Echo showed EF of 60-65% with mild aortic regurgitation. Carotid Doppler showed less than 50% stenosis bilaterally.    Initial labs during current hospitalization showing white count 27,000, hemoglobin 10.5, and platelets 79. Lactic acid 2.16>1.19. CT angio of chest/ abdomen/ pelvis negative for aortic dissection. Troponins peaked at 0.20. EKG w/o acute ischemic changes.   Meds:  Current Meds  Medication Sig  . albuterol (PROVENTIL HFA;VENTOLIN HFA) 108 (90 Base) MCG/ACT inhaler Inhale 1 puff into the lungs every 6 (six) hours as needed for wheezing or shortness of breath.  . allopurinol (ZYLOPRIM) 100 MG tablet Take 100 mg by mouth 2 (two) times daily.  . HYDROcodone-acetaminophen (NORCO/VICODIN) 5-325 MG tablet Take 1 tablet by mouth every 6 (six) hours as needed for moderate pain.  . levothyroxine (SYNTHROID, LEVOTHROID) 88 MCG tablet Take 88 mcg by mouth daily before breakfast.  . lisinopril (PRINIVIL,ZESTRIL) 10 MG tablet Take 10 mg by mouth daily.     Allergies: Allergies as of 01/22/2017 - Review Complete 01/22/2017  Allergen Reaction Noted  . Dilaudid [hydromorphone hcl] Anaphylaxis 01/25/2016  . Penicillins Swelling 01/25/2016  . Sulfa antibiotics Swelling 01/25/2016  . Tizanidine hcl Anxiety 01/25/2016   Past Medical History:  Diagnosis Date  . DDD (degenerative disc disease), thoracic   . Hyperlipidemia   . Hypertension   .   Kidney stones   . Mobitz II   . Thyroid cancer (HCC)    a. s/p thyroidecetomy    Family History: Mother with hypertension and lung disease. Father with hypertension and stomach cancer.  Social History: No ethanol, tobacco, or illicit drug use.  Review of Systems: A  complete ROS was negative except as per HPI.  Physical Exam: Blood pressure (!) 103/45, pulse 80, temperature 98.9 F (37.2 C), temperature source Oral, resp. rate 20, height 5' 6" (1.676 m), weight 83.1 kg (183 lb 4.8 oz), SpO2 94 %. Physical Exam  Constitutional: She is oriented to person, place, and time. She appears well-developed and well-nourished. No distress.  HENT:  Head: Normocephalic and atraumatic.  Eyes: EOM are normal.  Cardiovascular: Normal rate, regular rhythm and intact distal pulses.   No peripheral edema.  Pulmonary/Chest: Effort normal and breath sounds normal. No respiratory distress. She has no wheezes. She has no rales.  Abdominal: Soft. Bowel sounds are normal. She exhibits no distension. There is no tenderness.  Musculoskeletal: She exhibits no deformity.  Neurological: She is alert and oriented to person, place, and time.  Skin: Skin is warm and dry.    EKG: Paced rhythm. No acute ST/T wave changes.   CXR (01/22/17): Minimal left basilar atelectasis noted.  Lungs otherwise clear.    Assessment & Plan by Problem: Principal Problem:   Back pain Active Problems:   Hypothyroidism   HTN (hypertension)   Pain in the chest   Second degree Mobitz II AV block   Normocytic anemia   Thrombocytopenia (HCC)   Leukocytosis   Atypical chest pain Troponins 0.11>0.19>0.20>0.18.  EKG without acute ischemic changes. Recent cath (per ED provider note) showing a 70% ostial lesion in the first diagonal, 50% ostial disease in the second diagonal, 30% proximal disease, and mid vessel disease in the RCA. These findings are less likely to cause resting chest pain. Pharmacologic stress test done on 01/26/2016 was low risk. ACS less likely as she has been having persistent chest pain for a week. Her ESR and CRP came back elevated concerning for possible pericarditis. Back pain could also possibly be contributing to her symptoms. MRI of cervical and thoracic spine done 01/22/2017  showing mild cervical spondylosis and central disc protrusion at the level of the thoracic spine.  -Echo to r/o pericarditis. If echo is negative for pericarditis, consider ordering a cardiac MRI.  -Consider starting NSAIDs and colchicine -Continue Norco prn back pain  Hypertension: Stable.  -Continue home lisinopril   Anemia, thrombocytopenia, and leukocytosis Peripheral smear done during recent hospitalization was concerning for acute leukemia. Today's labs showing worsening leukocytosis, anemia, and thrombocytopenia. -Continue to trend CBC -Hematology/ oncology consult pending   Signed:  , MD 01/23/2017, 12:00 PM  Pager: [phone] 

## 2017-01-23 NOTE — Progress Notes (Signed)
PROGRESS NOTE    Sharon Pineda  [id number] DOB: September 13, 1941 DOA: 01/22/2017  PCP: Lanier Clam, MD   HPI: Pt is 76 y.o. female with known CAD, type 2 mobitz with pacer, HTN, thryoid CA s/p thyroidectomy, OSA on CPAP, who presented with 1 week duration of intermittent mid area chest pain that is occasionally but not consistently radiating to her right shoulder, 7/10 in severity when present and associated with upper area back pain. No specific alleviating factors but occasionally worse with movement. Pt went to [facility] 4 days prior to this admission (PTA) for this, was admitted, underwent LHC that showed an affected diagonal artery only, had a echo that was normal, had carotids that were normal, and was discharged one day PTA without a clear diagnosis. Cardiology was consulted here at [facility] for assistance as troponins remains mildly elevated. Telemetry bed requested.  Assessment and Plan:  1. Back pain and chest pain:  - unclear etiology at this time  - MRI C and T spine with and without contrast ordered but with no specific etiology that would explain this  - cardiology consulted for assistance but doubt more can be offered at this time as pt already had cardiac cath  - provide analgesia as needed   2. Anemia, thrombocytopenia and leukocytosis - path smear worrisome for CLL - oncology consulted and will see pt over the weekend - anemia panel requested   3. Hypothyroidism - Continue levothyroxine  4. Hypertension, essential  - Continue lisinopril  5. Chronic pain - Continue hydrocodone  DVT prophylaxis: SCD's Code Status: Full  Family Communication: Patient and family at bedside  Disposition Plan: to be determined   Consultants:  Cardiology  Oncology   Procedures:   None  Antimicrobials:   None  Subjective: Still with mostly right upper chest pain and back pain.   Objective: Vitals:   01/22/17 1430 01/22/17 2300 01/23/17 0605 01/23/17  1200  BP: (!) 133/51 (!) 116/42 (!) 103/45 (!) 114/48  Pulse: 74 95 80 85  Resp: 18 20 20 20   Temp: 98.2 F (36.8 C) 100.1 F (37.8 C) 98.9 F (37.2 C) 98.6 F (37 C)  TempSrc: Oral Oral Oral Oral  SpO2: 97% 93% 94% 90%  Weight: 83.6 kg (184 lb 3.2 oz)  83.1 kg (183 lb 4.8 oz)   Height: 5\' 6"  (1.676 m)       Intake/Output Summary (Last 24 hours) at 01/23/17 1649 Last data filed at 01/23/17 0500  Gross per 24 hour  Intake                0 ml  Output              750 ml  Net             -750 ml   Filed Weights   01/22/17 1430 01/23/17 0605  Weight: 83.6 kg (184 lb 3.2 oz) 83.1 kg (183 lb 4.8 oz)    Examination:  General exam: Appears calm and comfortable  Respiratory system: Clear to auscultation. Respiratory effort normal. Cardiovascular system: S1 & S2 heard, RRR. No JVD, rubs, gallops or clicks. No pedal edema. Gastrointestinal system: Abdomen is nondistended, soft and nontender. No organomegaly or masses felt.  Central nervous system: Alert and oriented. No focal neurological deficits. Extremities: Symmetric 5 x 5 power.  Data Reviewed: I have personally reviewed following labs and imaging studies  CBC:  Recent Labs Lab 01/22/17 0050 01/23/17 0530  WBC 27.3* 29.3*  NEUTROABS  1.1*  --   HGB 10.5* 9.3*  HCT 31.3* 28.3*  MCV 94.6 96.3  PLT 79* 68*   Basic Metabolic Panel:  Recent Labs Lab 01/22/17 0050 01/23/17 0530  NA 134* 136  K 4.0 4.0  CL 101 102  CO2 21* 26  GLUCOSE 183* 141*  BUN 11 12  CREATININE 0.81 0.90  CALCIUM 8.6* 8.2*   Liver Function Tests:  Recent Labs Lab 01/23/17 0530  AST 18  ALT 13*  ALKPHOS 51  BILITOT 0.7  PROT 5.8*  ALBUMIN 2.8*    Recent Labs Lab 01/22/17 0453  LIPASE 21   Cardiac Enzymes:  Recent Labs Lab 01/22/17 0535 01/22/17 1023 01/22/17 1558  TROPONINI 0.19* 0.20* 0.18*   Urine analysis:    Component Value Date/Time   COLORURINE YELLOW 01/22/2017 0309   APPEARANCEUR TURBID (A) 01/22/2017  0309   LABSPEC 1.030 01/22/2017 0309   PHURINE 5.5 01/22/2017 0309   GLUCOSEU NEGATIVE 01/22/2017 0309   HGBUR LARGE (A) 01/22/2017 0309   BILIRUBINUR NEGATIVE 01/22/2017 0309   KETONESUR NEGATIVE 01/22/2017 0309   PROTEINUR 100 (A) 01/22/2017 0309   NITRITE NEGATIVE 01/22/2017 0309   LEUKOCYTESUR NEGATIVE 01/22/2017 0309   Recent Results (from the past 240 hour(s))  Blood culture (routine x 2)     Status: None (Preliminary result)   Collection Time: 01/22/17  4:58 AM  Result Value Ref Range Status   Specimen Description BLOOD RIGHT HAND  Final   Special Requests BOTTLES DRAWN AEROBIC AND ANAEROBIC 5ML  Final   Culture NO GROWTH 1 DAY  Final   Report Status PENDING  Incomplete  Blood culture (routine x 2)     Status: None (Preliminary result)   Collection Time: 01/22/17  5:00 AM  Result Value Ref Range Status   Specimen Description BLOOD LEFT ARM  Final   Special Requests BOTTLES DRAWN AEROBIC AND ANAEROBIC 5ML  Final   Culture NO GROWTH 1 DAY  Final   Report Status PENDING  Incomplete      Radiology Studies: Dg Chest 2 View  Result Date: 01/22/2017 CLINICAL DATA:  Acute onset of mid chest pain and pain between the scapula. Initial encounter. EXAM: CHEST  2 VIEW COMPARISON:  Chest radiograph from 01/29/2016 FINDINGS: The lungs are well-aerated. Minimal left basilar atelectasis is noted. There is no evidence of focal opacification, pleural effusion or pneumothorax. The heart is borderline normal in size. A pacemaker is noted at the left chest wall, with leads ending at the right atrium and right ventricle. No acute osseous abnormalities are seen. Clips are noted within the right upper quadrant, reflecting prior cholecystectomy. A bone island is seen at the proximal right humerus. IMPRESSION: Minimal left basilar atelectasis noted.  Lungs otherwise clear. Electronically Signed   By: Garald Balding M.D.   On: 01/22/2017 01:19   Mr Cervical Spine W Wo Contrast  Result Date:  01/22/2017 CLINICAL DATA:  76 y/o F; chronic back and neck pain with 1 week of right arm numbness and tingling also occasionally in the left arm. EXAM: MRI CERVICAL SPINE WITHOUT AND WITH CONTRAST MRI THORACIC SPINE WITHOUT AND WITH CONTRAST TECHNIQUE: Multiplanar and multiecho pulse sequences of the cervical spine and thoracic spine, to include the craniocervical junction thoracolumbar, and cervicothoracic junction, were obtained without and with intravenous contrast. CONTRAST:  21mL MULTIHANCE GADOBENATE DIMEGLUMINE 529 MG/ML IV SOLN COMPARISON:  None. FINDINGS: Cervical spine MRI findings: Alignment: Straightening of cervical lordosis with slight reversal at the C5-6 level. No listhesis. Vertebrae:  No fracture, evidence of discitis, or bone lesion. No abnormal enhancement. Cord: No abnormal cord signal appear no abnormal enhancement. Posterior Fossa, vertebral arteries, paraspinal tissues: Negative. Disc levels: C2-3: No significant disc displacement, foraminal narrowing, or canal stenosis. C3-4: No significant disc displacement, foraminal narrowing, or canal stenosis. C4-5: No significant disc displacement, foraminal narrowing, or canal stenosis. Mild facet hypertrophy. C5-6: Small disc osteophyte complex with bilateral uncovertebral and facet hypertrophy. Mild canal stenosis and left-sided foraminal narrowing. C6-7: Small disc osteophyte complex and bilateral uncovertebral/ facet hypertrophy. Mild bilateral foraminal narrowing and canal stenosis. C7-T1: No significant disc displacement, foraminal narrowing, or canal stenosis. Thoracic spine MRI findings: Alignment: Physiologic. Vertebrae: No fracture, evidence of discitis, or bone lesion. No abnormal enhancement. Cord: Normal signal and morphology.  No abnormal enhancement. Posterior Fossa, vertebral arteries, paraspinal tissues: Negative. Disc levels: Small central disc protrusions at the T3-4, T4-5, T5-6, T6-7, T7-8 levels. Protrusions at T5-6, T6-7, and T7-8  contact the anterior cord with mild cord flattening, this is most pronounced at T7-8. No significant foraminal narrowing or canal stenosis. IMPRESSION: 1. No acute osseous abnormality or abnormal enhancement of the cervical and thoracic spine. 2. No abnormal cord signal or enhancement. 3. Mild cervical spondylosis predominantly at the C5-6 and C6-7 levels were disc and facet degenerative changes result in mild foraminal narrowing. No significant cervical canal stenosis. 4. Multiple small thoracic spine central disc protrusions. Protrusions at T5-6, T6-7, and T7-8 contact the anterior cord with mild cord flattening, this is most pronounced at T7-8. 5. No significant thoracic spine foraminal or canal stenosis. Electronically Signed   By: Kristine Garbe M.D.   On: 01/22/2017 20:43   Mr Thoracic Spine W Wo Contrast  Result Date: 01/22/2017 CLINICAL DATA:  76 y/o F; chronic back and neck pain with 1 week of right arm numbness and tingling also occasionally in the left arm. EXAM: MRI CERVICAL SPINE WITHOUT AND WITH CONTRAST MRI THORACIC SPINE WITHOUT AND WITH CONTRAST TECHNIQUE: Multiplanar and multiecho pulse sequences of the cervical spine and thoracic spine, to include the craniocervical junction thoracolumbar, and cervicothoracic junction, were obtained without and with intravenous contrast. CONTRAST:  66mL MULTIHANCE GADOBENATE DIMEGLUMINE 529 MG/ML IV SOLN COMPARISON:  None. FINDINGS: Cervical spine MRI findings: Alignment: Straightening of cervical lordosis with slight reversal at the C5-6 level. No listhesis. Vertebrae: No fracture, evidence of discitis, or bone lesion. No abnormal enhancement. Cord: No abnormal cord signal appear no abnormal enhancement. Posterior Fossa, vertebral arteries, paraspinal tissues: Negative. Disc levels: C2-3: No significant disc displacement, foraminal narrowing, or canal stenosis. C3-4: No significant disc displacement, foraminal narrowing, or canal stenosis. C4-5: No  significant disc displacement, foraminal narrowing, or canal stenosis. Mild facet hypertrophy. C5-6: Small disc osteophyte complex with bilateral uncovertebral and facet hypertrophy. Mild canal stenosis and left-sided foraminal narrowing. C6-7: Small disc osteophyte complex and bilateral uncovertebral/ facet hypertrophy. Mild bilateral foraminal narrowing and canal stenosis. C7-T1: No significant disc displacement, foraminal narrowing, or canal stenosis. Thoracic spine MRI findings: Alignment: Physiologic. Vertebrae: No fracture, evidence of discitis, or bone lesion. No abnormal enhancement. Cord: Normal signal and morphology.  No abnormal enhancement. Posterior Fossa, vertebral arteries, paraspinal tissues: Negative. Disc levels: Small central disc protrusions at the T3-4, T4-5, T5-6, T6-7, T7-8 levels. Protrusions at T5-6, T6-7, and T7-8 contact the anterior cord with mild cord flattening, this is most pronounced at T7-8. No significant foraminal narrowing or canal stenosis. IMPRESSION: 1. No acute osseous abnormality or abnormal enhancement of the cervical and thoracic spine. 2. No abnormal cord  signal or enhancement. 3. Mild cervical spondylosis predominantly at the C5-6 and C6-7 levels were disc and facet degenerative changes result in mild foraminal narrowing. No significant cervical canal stenosis. 4. Multiple small thoracic spine central disc protrusions. Protrusions at T5-6, T6-7, and T7-8 contact the anterior cord with mild cord flattening, this is most pronounced at T7-8. 5. No significant thoracic spine foraminal or canal stenosis. Electronically Signed   By: Kristine Garbe M.D.   On: 01/22/2017 20:43   Ct Angio Chest/abd/pel For Dissection W And/or Wo Contrast  Result Date: 01/22/2017 CLINICAL DATA:  Midsternal chest pain radiating into both upper extremities and to the back. Nausea. Recent hospital discharge after full cardiac workup for pain. EXAM: CT ANGIOGRAPHY CHEST, ABDOMEN AND PELVIS  TECHNIQUE: Multidetector CT imaging through the chest, abdomen and pelvis was performed using the standard protocol during bolus administration of intravenous contrast. Multiplanar reconstructed images and MIPs were obtained and reviewed to evaluate the vascular anatomy. CONTRAST:  100 mL Isovue 370 intravenous COMPARISON:  Radiographs 01/22/2017 FINDINGS: CTA CHEST FINDINGS Cardiovascular: Preferential opacification of the thoracic aorta. The aorta is normal in caliber with moderate atherosclerotic plaque but no aneurysm or dissection. Proximal great vessels are patent. Pulmonary arterial vasculature is also well opacified with no evidence of embolism. Moderate coronary artery calcified plaque. Mediastinum/Nodes: No enlarged mediastinal, hilar, or axillary lymph nodes. Thyroid gland, trachea, and esophagus demonstrate no significant findings. Lungs/Pleura: Lungs are clear. No pleural effusion or pneumothorax. Musculoskeletal: No chest wall abnormality. No acute or significant osseous findings. Review of the MIP images confirms the above findings. CTA ABDOMEN AND PELVIS FINDINGS VASCULAR Aorta: Normal caliber abdominal aorta with moderate atherosclerotic plaque. No dissection or significant stenosis. Celiac: Patent without evidence of aneurysm, dissection, vasculitis or significant stenosis. SMA: Patent without evidence of aneurysm, dissection, vasculitis or significant stenosis. Renals: Accessory renal artery on the left. The renal arteries are patent without evidence of aneurysm, dissection, vasculitis, fibromuscular dysplasia or significant stenosis. IMA: Patent without evidence of aneurysm, dissection, vasculitis or significant stenosis. Inflow: Patent without evidence of aneurysm, dissection, vasculitis or significant stenosis.Generous atherosclerotic calcification. Veins: No obvious venous abnormality within the limitations of this arterial phase study. Review of the MIP images confirms the above findings.  NON-VASCULAR Hepatobiliary: No focal liver abnormality is seen. Status post cholecystectomy. No biliary dilatation. Pancreas: Unremarkable. No pancreatic ductal dilatation or surrounding inflammatory changes. Spleen: Normal in size without focal abnormality. Adrenals/Urinary Tract: Adrenal glands are unremarkable. Kidneys are normal, without renal calculi, focal lesion, or hydronephrosis. Bladder is unremarkable. Stomach/Bowel: Hiatal hernia. Stomach and small bowel appear otherwise unremarkable. Colon is unremarkable. Lymphatic: No pathologic adenopathy in the abdomen or pelvis. Reproductive: Status post hysterectomy. No adnexal masses. Other: No acute findings.  No ascites. Musculoskeletal: No significant skeletal lesion. Review of the MIP images confirms the above findings. IMPRESSION: 1. Normal caliber aorta with moderate atherosclerotic plaque. No evidence of dissection, aneurysm or significant stenosis. 2. No acute findings are evident in the chest, abdomen or pelvis. 3. Hiatal hernia. Electronically Signed   By: Andreas Newport M.D.   On: 01/22/2017 03:56      Scheduled Meds: . allopurinol  100 mg Oral BID  . levothyroxine  88 mcg Oral QAC breakfast  . lisinopril  10 mg Oral Daily   Continuous Infusions:   LOS: 0 days   Time spent: 20 minutes   Faye Ramsay, MD [facility] Pager [phone]  If 7PM-7AM, please contact night-coverage [email or web address] Password [facility] 01/23/2017, 4:49 PM

## 2017-01-24 ENCOUNTER — Encounter (HOSPITAL_COMMUNITY): Payer: Self-pay | Admitting: Hematology and Oncology

## 2017-01-24 DIAGNOSIS — Z515 Encounter for palliative care: Secondary | ICD-10-CM | POA: Diagnosis present

## 2017-01-24 DIAGNOSIS — I10 Essential (primary) hypertension: Secondary | ICD-10-CM | POA: Diagnosis present

## 2017-01-24 DIAGNOSIS — D72829 Elevated white blood cell count, unspecified: Secondary | ICD-10-CM

## 2017-01-24 DIAGNOSIS — D72820 Lymphocytosis (symptomatic): Secondary | ICD-10-CM | POA: Diagnosis not present

## 2017-01-24 DIAGNOSIS — E785 Hyperlipidemia, unspecified: Secondary | ICD-10-CM | POA: Diagnosis present

## 2017-01-24 DIAGNOSIS — M549 Dorsalgia, unspecified: Secondary | ICD-10-CM | POA: Diagnosis present

## 2017-01-24 DIAGNOSIS — Z87442 Personal history of urinary calculi: Secondary | ICD-10-CM | POA: Diagnosis not present

## 2017-01-24 DIAGNOSIS — E890 Postprocedural hypothyroidism: Secondary | ICD-10-CM | POA: Diagnosis present

## 2017-01-24 DIAGNOSIS — E876 Hypokalemia: Secondary | ICD-10-CM | POA: Diagnosis not present

## 2017-01-24 DIAGNOSIS — R072 Precordial pain: Secondary | ICD-10-CM | POA: Diagnosis not present

## 2017-01-24 DIAGNOSIS — Z880 Allergy status to penicillin: Secondary | ICD-10-CM | POA: Diagnosis not present

## 2017-01-24 DIAGNOSIS — Z888 Allergy status to other drugs, medicaments and biological substances status: Secondary | ICD-10-CM | POA: Diagnosis not present

## 2017-01-24 DIAGNOSIS — Z79899 Other long term (current) drug therapy: Secondary | ICD-10-CM | POA: Diagnosis not present

## 2017-01-24 DIAGNOSIS — D649 Anemia, unspecified: Secondary | ICD-10-CM | POA: Diagnosis not present

## 2017-01-24 DIAGNOSIS — Z85850 Personal history of malignant neoplasm of thyroid: Secondary | ICD-10-CM | POA: Diagnosis not present

## 2017-01-24 DIAGNOSIS — C9200 Acute myeloblastic leukemia, not having achieved remission: Secondary | ICD-10-CM | POA: Diagnosis present

## 2017-01-24 DIAGNOSIS — B3323 Viral pericarditis: Secondary | ICD-10-CM | POA: Diagnosis not present

## 2017-01-24 DIAGNOSIS — G4733 Obstructive sleep apnea (adult) (pediatric): Secondary | ICD-10-CM | POA: Diagnosis present

## 2017-01-24 DIAGNOSIS — M79603 Pain in arm, unspecified: Secondary | ICD-10-CM | POA: Diagnosis present

## 2017-01-24 DIAGNOSIS — D61818 Other pancytopenia: Secondary | ICD-10-CM | POA: Diagnosis present

## 2017-01-24 DIAGNOSIS — I2510 Atherosclerotic heart disease of native coronary artery without angina pectoris: Secondary | ICD-10-CM | POA: Diagnosis present

## 2017-01-24 DIAGNOSIS — R61 Generalized hyperhidrosis: Secondary | ICD-10-CM | POA: Diagnosis not present

## 2017-01-24 DIAGNOSIS — I319 Disease of pericardium, unspecified: Secondary | ICD-10-CM | POA: Diagnosis present

## 2017-01-24 DIAGNOSIS — R079 Chest pain, unspecified: Secondary | ICD-10-CM | POA: Diagnosis not present

## 2017-01-24 DIAGNOSIS — G8929 Other chronic pain: Secondary | ICD-10-CM | POA: Diagnosis present

## 2017-01-24 DIAGNOSIS — N179 Acute kidney failure, unspecified: Secondary | ICD-10-CM | POA: Diagnosis present

## 2017-01-24 DIAGNOSIS — Z8249 Family history of ischemic heart disease and other diseases of the circulatory system: Secondary | ICD-10-CM | POA: Diagnosis not present

## 2017-01-24 DIAGNOSIS — Z882 Allergy status to sulfonamides status: Secondary | ICD-10-CM | POA: Diagnosis not present

## 2017-01-24 DIAGNOSIS — M546 Pain in thoracic spine: Secondary | ICD-10-CM | POA: Diagnosis not present

## 2017-01-24 DIAGNOSIS — I441 Atrioventricular block, second degree: Secondary | ICD-10-CM | POA: Diagnosis present

## 2017-01-24 DIAGNOSIS — Z950 Presence of cardiac pacemaker: Secondary | ICD-10-CM | POA: Diagnosis not present

## 2017-01-24 LAB — FOLATE: FOLATE: 14.7 ng/mL (ref >5.9)

## 2017-01-24 LAB — BASIC METABOLIC PANEL
ANION GAP: 8 (ref 5–15)
BUN: 14 mg/dL (ref 6–20)
CO2: 28 mmol/L (ref 22–32)
Calcium: 8.2 mg/dL — ABNORMAL LOW (ref 8.9–10.3)
Chloride: 100 mmol/L — ABNORMAL LOW (ref 101–111)
Creatinine, Ser: 0.98 mg/dL (ref 0.44–1.00)
GFR calc Af Amer: >60 mL/min (ref >60)
GFR, EST NON AFRICAN AMERICAN: 55 mL/min — AB (ref >60)
GLUCOSE: 149 mg/dL — AB (ref 65–99)
Potassium: 4.2 mmol/L (ref 3.5–5.1)
SODIUM: 136 mmol/L (ref 135–145)

## 2017-01-24 LAB — CBC
HCT: 28 % — ABNORMAL LOW (ref 36.0–46.0)
Hemoglobin: 9.3 g/dL — ABNORMAL LOW (ref 12.0–15.0)
MCH: 32.3 pg (ref 26.0–34.0)
MCHC: 33.2 g/dL (ref 30.0–36.0)
MCV: 97.2 fL (ref 78.0–100.0)
PLATELETS: 59 10*3/uL — AB (ref 150–400)
RBC: 2.88 MIL/uL — AB (ref 3.87–5.11)
RDW: 19.1 % — AB (ref 11.5–15.5)
WBC: 33.6 10*3/uL — AB (ref 4.0–10.5)

## 2017-01-24 LAB — RETICULOCYTES
RBC.: 2.88 MIL/uL — ABNORMAL LOW (ref 3.87–5.11)
Retic Count, Absolute: 49 10*3/uL (ref 19.0–186.0)
Retic Ct Pct: 1.7 % (ref 0.4–3.1)

## 2017-01-24 LAB — IRON AND TIBC
Iron: 53 ug/dL (ref 28–170)
Saturation Ratios: 26 % (ref 10.4–31.8)
TIBC: 206 ug/dL — ABNORMAL LOW (ref 250–450)
UIBC: 153 ug/dL

## 2017-01-24 LAB — FERRITIN: Ferritin: 483 ng/mL — ABNORMAL HIGH (ref 11–307)

## 2017-01-24 LAB — VITAMIN B12: VITAMIN B 12: 836 pg/mL (ref 180–914)

## 2017-01-24 MED ORDER — IBUPROFEN 600 MG PO TABS
800.0000 mg | ORAL_TABLET | Freq: Three times a day (TID) | ORAL | Status: DC
  Administered 2017-01-24 – 2017-01-27 (×10): 800 mg via ORAL
  Filled 2017-01-24 (×11): qty 1

## 2017-01-24 MED ORDER — COLCHICINE 0.6 MG PO TABS
0.6000 mg | ORAL_TABLET | Freq: Two times a day (BID) | ORAL | Status: DC
  Administered 2017-01-24 – 2017-01-27 (×7): 0.6 mg via ORAL
  Filled 2017-01-24 (×7): qty 1

## 2017-01-24 NOTE — Progress Notes (Signed)
[facility] NOTE  Patient Care Team: Lanier Clam, MD as PCP - General (Family Medicine)  CHIEF COMPLAINTS/PURPOSE OF CONSULTATION:  Leukocytosis, anemia and thrombocytopenia  HISTORY OF PRESENTING ILLNESS:  Sharon Pineda 76 y.o. female is seen because of abnormal CBC.  She was found to have abnormal CBC from from this admission On review of prior records in February 2017, she had normal CBC. Outside records from care everywhere showed normal CBC in June 2017 Since admitted on 01/22/17, she is noted to have leukocytosis with anemia and thrombocytopenia She was recently treated for sinus infection There is not reported symptoms of urinary frequency/urgency or dysuria, diarrhea, joint swelling/pain or abnormal skin rash.  The patient denies any recent signs or symptoms of bleeding such as spontaneous epistaxis, hematuria or hematochezia. She had prior diagnosis of thyroid cancer, last treated over 30 years ago. Her age appropriate screening programs are up-to-date. She is non-smoker Her mother died of lung cancer and father from stomach cancer, both diagnosed in their 45s. She had frequent almost daily drenching night sweats She was admitted for severe resting chest pain. Cardiology work-up in progress She continues to have persistent chest pain at rest, worse with inspiration She denies recent weight loss  MEDICAL HISTORY:  Past Medical History:  Diagnosis Date  . Cancer [facility])    thyroid cancer; had received radioactive iodine before  . DDD (degenerative disc disease), thoracic   . Hyperlipidemia   . Hypertension   . Kidney stones   . Mobitz II   . Thyroid cancer ([place])    a. s/p thyroidecetomy    SURGICAL HISTORY: Past Surgical History:  Procedure Laterality Date  . ABDOMINAL HYSTERECTOMY    . CHOLECYSTECTOMY    . 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 JOINT REPLACEMENT    .  THYROID SURGERY      SOCIAL HISTORY: Social History   Social History  . Marital status: Widowed    Spouse name: N/A  . Number of children: 5  . Years of education: N/A   Occupational History  . retired [employer]ar[employer] manager    Social History Main Topics  . Smoking status: Never Smoker  . Smokeless tobacco: Never Used  . Alcohol use No  . Drug use: No  . Sexual activity: Not on file   Other Topics Concern  . Not on file   Social History Narrative   [place]. Son with CHF and other medical problems lives with her    FAMILY HISTORY: Family History  Problem Relation Age of Onset  . Hypertension Mother   . Lung cancer Mother   . Hypertension Father   . Stomach cancer Father     ALLERGIES:  is allergic to dilaudid [hydromorphone hcl]; penicillins; sulfa antibiotics; and tizanidine hcl.  MEDICATIONS:  Current Facility-Administered Medications  Medication Dose Route Frequency Provider Last Rate Last Dose  . allopurinol (ZYLOPRIM) tablet 100 mg  100 mg Oral BID Edwin Dada, MD   100 mg at 01/23/17 2109  . HYDROcodone-acetaminophen (NORCO/VICODIN) 5-325 MG per tablet 1 tablet  1 tablet Oral Q6H PRN Edwin Dada, MD   1 tablet at 01/24/17 0455  . levothyroxine (SYNTHROID, LEVOTHROID) tablet 88 mcg  88 mcg Oral QAC breakfast Edwin Dada, MD   88 mcg at 01/23/17 0835  . lisinopril (PRINIVIL,ZESTRIL) tablet 10 mg  10 mg Oral Daily [employer],  MD   10 mg at 01/23/17 1118  . morphine 4 MG/ML injection 2 mg  2 mg Intravenous Q4H PRN Edwin Dada, MD   2 mg at 01/24/17 0202  . ondansetron (ZOFRAN) tablet 4 mg  4 mg Oral Q6H PRN Edwin Dada, MD       Or  . ondansetron (ZOFRAN) injection 4 mg  4 mg Intravenous Q6H PRN Edwin Dada, MD        REVIEW OF SYSTEMS:   Constitutional: Denies fevers, chills  Eyes: Denies blurriness of vision, double vision or watery eyes Ears, nose, mouth, throat, and face: Denies mucositis or sore  throat Respiratory: Denies cough, dyspnea or wheezes Gastrointestinal:  Denies nausea, heartburn or change in bowel habits Skin: Denies abnormal skin rashes Lymphatics: Denies new lymphadenopathy or easy bruising Neurological:Denies numbness, tingling or new weaknesses Behavioral/Psych: Mood is stable, no new changes  All other systems were reviewed with the patient and are negative.  PHYSICAL EXAMINATION: ECOG PERFORMANCE STATUS: 1 - Symptomatic but completely ambulatory  Vitals:   01/24/17 0450 01/24/17 0828  BP: (!) 114/43 (!) 108/46  Pulse: 87 90  Resp: 18 20  Temp: 98.6 F (37 C) 99.3 F (37.4 C)   Filed Weights   01/22/17 1430 01/23/17 0605 01/24/17 0450  Weight: 184 lb 3.2 oz (83.6 kg) 183 lb 4.8 oz (83.1 kg) 182 lb 11.2 oz (82.9 kg)    GENERAL:alert, no distress and comfortable SKIN: skin color, texture, turgor are normal, no rashes or significant lesions EYES: normal, conjunctiva are pink and non-injected, sclera clear OROPHARYNX:no exudate, no erythema and lips, buccal mucosa, and tongue normal  NECK: supple, thyroid normal size, non-tender, without nodularity LYMPH:  no palpable lymphadenopathy in the cervical, axillary or inguinal LUNGS: clear to auscultation and percussion with normal breathing effort HEART: regular rate & rhythm and no murmurs and no lower extremity edema ABDOMEN:abdomen soft, non-tender and normal bowel sounds Musculoskeletal:no cyanosis of digits and no clubbing  PSYCH: alert & oriented x 3 with fluent speech NEURO: no focal motor/sensory deficits  LABORATORY DATA:  I have reviewed the data as listed Recent Results (from the past 2160 hour(s))  Implantable device - remote     Status: None   Collection Time: 11/26/16  3:16 PM  Result Value Ref Range   Date Time Interrogation Session [id number]    Pulse Generator Manufacturer MERM    Pulse Gen Model [id number] Advisa DR MRI    Pulse Gen Serial Number [id number]    Clinic Name [facility]    Implantable Pulse Generator Type Implantable Pulse Generator    Implantable Pulse Generator Implant Date [id number]    Implantable Lead Manufacturer MERM    Implantable Lead Model 5076 CapSureFix Novus MRI SureScan    Implantable Lead Serial Number [id number]    Implantable Lead Implant Date 09233007    Implantable Lead Location Detail 1 APPENDAGE    Implantable Lead Location [id number]    Implantable Lead Manufacturer MERM    Implantable Lead Model 5076 CapSureFix Novus MRI SureScan    Implantable Lead Serial Number [id number]    Implantable Lead Implant Date [id number]    Implantable Lead Location Detail 1 APEX    Implantable Lead Location [id number]    Lead Channel Setting Sensing Sensitivity 2.8 mV   Lead Channel Setting Pacing Amplitude 2 V   Lead Channel Setting Pacing Pulse Width 0.4 ms   Lead Channel Setting Pacing Amplitude 2.5 V   Lead Channel Impedance Value 551  ohm   Lead Channel Impedance Value 418 ohm   Lead Channel Sensing Intrinsic Amplitude 2.875 mV   Lead Channel Sensing Intrinsic Amplitude 2.875 mV   Lead Channel Pacing Threshold Amplitude 0.5 V   Lead Channel Pacing Threshold Pulse Width 0.4 ms   Lead Channel Impedance Value 551 ohm   Lead Channel Impedance Value 475 ohm   Lead Channel Sensing Intrinsic Amplitude 31.625 mV   Lead Channel Sensing Intrinsic Amplitude 31.625 mV   Lead Channel Pacing Threshold Amplitude 0.625 V   Lead Channel Pacing Threshold Pulse Width 0.4 ms   Battery Status OK    Battery Remaining Longevity 98 mo   Battery Voltage 3.01 V   Brady Statistic RA Percent Paced 31.82 %   Brady Statistic RV Percent Paced 99.38 %   Brady Statistic AP VP Percent 32.28 %   Brady Statistic AS VP Percent 67.59 %   Brady Statistic AP VS Percent 0.01 %   Brady Statistic AS VS Percent 0.12 %   Eval Rhythm Ap/Vp   Basic metabolic panel     Status: Abnormal   Collection Time: 01/22/17 12:50 AM  Result Value Ref Range   Sodium 134 (L) 135 - 145 mmol/L    Potassium 4.0 3.5 - 5.1 mmol/L   Chloride 101 101 - 111 mmol/L   CO2 21 (L) 22 - 32 mmol/L   Glucose, Bld 183 (H) 65 - 99 mg/dL   BUN 11 6 - 20 mg/dL   Creatinine, Ser 0.81 0.44 - 1.00 mg/dL   Calcium 8.6 (L) 8.9 - 10.3 mg/dL   GFR calc non Af Amer >60 >60 mL/min   GFR calc Af Amer >60 >60 mL/min    Comment: (NOTE) The eGFR has been calculated using the CKD EPI equation. This calculation has not been validated in all clinical situations. eGFR's persistently <60 mL/min signify possible Chronic Kidney Disease.    Anion gap 12 5 - 15  CBC     Status: Abnormal   Collection Time: 01/22/17 12:50 AM  Result Value Ref Range   WBC 27.3 (H) 4.0 - 10.5 K/uL   RBC 3.31 (L) 3.87 - 5.11 MIL/uL   Hemoglobin 10.5 (L) 12.0 - 15.0 g/dL   HCT 31.3 (L) 36.0 - 46.0 %   MCV 94.6 78.0 - 100.0 fL   MCH 31.7 26.0 - 34.0 pg   MCHC 33.5 30.0 - 36.0 g/dL   RDW 18.4 (H) 11.5 - 15.5 %   Platelets 79 (L) 150 - 400 K/uL    Comment: SPECIMEN CHECKED FOR CLOTS REPEATED TO VERIFY PLATELET COUNT CONFIRMED BY SMEAR   Differential     Status: Abnormal   Collection Time: 01/22/17 12:50 AM  Result Value Ref Range   Neutrophils Relative % 3 %   Lymphocytes Relative 27 %   Monocytes Relative 68 %   Eosinophils Relative 1 %   Basophils Relative 0 %   Band Neutrophils 1 %   Metamyelocytes Relative 0 %   Myelocytes 0 %   Promyelocytes Absolute 0 %   Blasts 0 %   nRBC 0 0 /100 WBC   Other 0 %   Neutro Abs 1.1 (L) 1.7 - 7.7 K/uL   Lymphs Abs 7.4 (H) 0.7 - 4.0 K/uL   Monocytes Absolute 18.5 (H) 0.1 - 1.0 K/uL   Eosinophils Absolute 0.3 0.0 - 0.7 K/uL   Basophils Absolute 0.0 0.0 - 0.1 K/uL   RBC Morphology TEARDROP CELLS   Pathologist smear review  Status: None   Collection Time: 01/22/17 12:50 AM  Result Value Ref Range   Path Review      ABSOLUTE LYMPHOCYTOSIS, CONSISTENT WITH CLL.  SUGGEST IMMUNOPHENOTYPING IF A NEW PERSISTENT FINDING.    Comment: Reviewed by Casimer Lanius, MD 01/22/2017.    I-stat troponin, ED     Status: None   Collection Time: 01/22/17  1:01 AM  Result Value Ref Range   Troponin i, poc 0.06 0.00 - 0.08 ng/mL   Comment 3            Comment: Due to the release kinetics of cTnI, a negative result within the first hours of the onset of symptoms does not rule out myocardial infarction with certainty. If myocardial infarction is still suspected, repeat the test at appropriate intervals.   I-Stat CG4 Lactic Acid, ED     Status: Abnormal   Collection Time: 01/22/17  2:07 AM  Result Value Ref Range   Lactic Acid, Venous 2.16 (HH) 0.5 - 1.9 mmol/L   Comment NOTIFIED PHYSICIAN   Urinalysis, Routine w reflex microscopic     Status: Abnormal   Collection Time: 01/22/17  3:09 AM  Result Value Ref Range   Color, Urine YELLOW YELLOW   APPearance TURBID (A) CLEAR   Specific Gravity, Urine 1.030 1.005 - 1.030   pH 5.5 5.0 - 8.0   Glucose, UA NEGATIVE NEGATIVE mg/dL   Hgb urine dipstick LARGE (A) NEGATIVE   Bilirubin Urine NEGATIVE NEGATIVE   Ketones, ur NEGATIVE NEGATIVE mg/dL   Protein, ur 100 (A) NEGATIVE mg/dL   Nitrite NEGATIVE NEGATIVE   Leukocytes, UA NEGATIVE NEGATIVE  Urinalysis, Microscopic (reflex)     Status: Abnormal   Collection Time: 01/22/17  3:09 AM  Result Value Ref Range   RBC / HPF 0-5 0 - 5 RBC/hpf   WBC, UA NONE SEEN 0 - 5 WBC/hpf   Bacteria, UA FEW (A) NONE SEEN   Squamous Epithelial / LPF 0-5 (A) NONE SEEN   Amorphous Crystal PRESENT   Sedimentation rate     Status: Abnormal   Collection Time: 01/22/17  4:43 AM  Result Value Ref Range   Sed Rate 58 (H) 0 - 22 mm/hr  C-reactive protein     Status: Abnormal   Collection Time: 01/22/17  4:43 AM  Result Value Ref Range   CRP 10.7 (H) <1.0 mg/dL  Lipase, blood     Status: None   Collection Time: 01/22/17  4:53 AM  Result Value Ref Range   Lipase 21 11 - 51 U/L  Blood culture (routine x 2)     Status: None (Preliminary result)   Collection Time: 01/22/17  4:58 AM  Result Value Ref  Range   Specimen Description BLOOD RIGHT HAND    Special Requests BOTTLES DRAWN AEROBIC AND ANAEROBIC 5ML    Culture NO GROWTH 1 DAY    Report Status PENDING   Blood culture (routine x 2)     Status: None (Preliminary result)   Collection Time: 01/22/17  5:00 AM  Result Value Ref Range   Specimen Description BLOOD LEFT ARM    Special Requests BOTTLES DRAWN AEROBIC AND ANAEROBIC 5ML    Culture NO GROWTH 1 DAY    Report Status PENDING   I-stat troponin, ED     Status: Abnormal   Collection Time: 01/22/17  5:09 AM  Result Value Ref Range   Troponin i, poc 0.11 (HH) 0.00 - 0.08 ng/mL   Comment NOTIFIED PHYSICIAN  Comment 3            Comment: Due to the release kinetics of cTnI, a negative result within the first hours of the onset of symptoms does not rule out myocardial infarction with certainty. If myocardial infarction is still suspected, repeat the test at appropriate intervals.   I-Stat CG4 Lactic Acid, ED     Status: None   Collection Time: 01/22/17  5:12 AM  Result Value Ref Range   Lactic Acid, Venous 1.19 0.5 - 1.9 mmol/L  Troponin I     Status: Abnormal   Collection Time: 01/22/17  5:35 AM  Result Value Ref Range   Troponin I 0.19 (HH) <0.03 ng/mL    Comment: CRITICAL RESULT CALLED TO, READ BACK BY AND VERIFIED WITH: BROWN M,RN 01/22/17 0628 WAYK   Troponin I (q 6hr x 3)     Status: Abnormal   Collection Time: 01/22/17 10:23 AM  Result Value Ref Range   Troponin I 0.20 (HH) <0.03 ng/mL    Comment: CRITICAL VALUE NOTED.  VALUE IS CONSISTENT WITH PREVIOUSLY REPORTED AND CALLED VALUE.  Troponin I (q 6hr x 3)     Status: Abnormal   Collection Time: 01/22/17  3:58 PM  Result Value Ref Range   Troponin I 0.18 (HH) <0.03 ng/mL    Comment: CRITICAL VALUE NOTED.  VALUE IS CONSISTENT WITH PREVIOUSLY REPORTED AND CALLED VALUE.  CBC     Status: Abnormal   Collection Time: 01/23/17  5:30 AM  Result Value Ref Range   WBC 29.3 (H) 4.0 - 10.5 K/uL   RBC 2.94 (L) 3.87 - 5.11  MIL/uL   Hemoglobin 9.3 (L) 12.0 - 15.0 g/dL   HCT 28.3 (L) 36.0 - 46.0 %   MCV 96.3 78.0 - 100.0 fL   MCH 31.6 26.0 - 34.0 pg   MCHC 32.9 30.0 - 36.0 g/dL   RDW 18.9 (H) 11.5 - 15.5 %   Platelets 68 (L) 150 - 400 K/uL    Comment: REPEATED TO VERIFY CONSISTENT WITH PREVIOUS RESULT   Comprehensive metabolic panel     Status: Abnormal   Collection Time: 01/23/17  5:30 AM  Result Value Ref Range   Sodium 136 135 - 145 mmol/L   Potassium 4.0 3.5 - 5.1 mmol/L   Chloride 102 101 - 111 mmol/L   CO2 26 22 - 32 mmol/L   Glucose, Bld 141 (H) 65 - 99 mg/dL   BUN 12 6 - 20 mg/dL   Creatinine, Ser 0.90 0.44 - 1.00 mg/dL   Calcium 8.2 (L) 8.9 - 10.3 mg/dL   Total Protein 5.8 (L) 6.5 - 8.1 g/dL   Albumin 2.8 (L) 3.5 - 5.0 g/dL   AST 18 15 - 41 U/L   ALT 13 (L) 14 - 54 U/L   Alkaline Phosphatase 51 38 - 126 U/L   Total Bilirubin 0.7 0.3 - 1.2 mg/dL   GFR calc non Af Amer >60 >60 mL/min   GFR calc Af Amer >60 >60 mL/min    Comment: (NOTE) The eGFR has been calculated using the CKD EPI equation. This calculation has not been validated in all clinical situations. eGFR's persistently <60 mL/min signify possible Chronic Kidney Disease.    Anion gap 8 5 - 15  ECHOCARDIOGRAM COMPLETE     Status: None   Collection Time: 01/23/17  3:29 PM  Result Value Ref Range   Weight 2,932.8 oz   Height 66 in   BP 114/48 mmHg  Vitamin B12  Status: None   Collection Time: 01/24/17  3:54 AM  Result Value Ref Range   Vitamin B-12 836 180 - 914 pg/mL    Comment: (NOTE) This assay is not validated for testing neonatal or myeloproliferative syndrome specimens for Vitamin B12 levels.   Folate     Status: None   Collection Time: 01/24/17  3:54 AM  Result Value Ref Range   Folate 14.7 >5.9 ng/mL  Iron and TIBC     Status: Abnormal   Collection Time: 01/24/17  3:54 AM  Result Value Ref Range   Iron 53 28 - 170 ug/dL   TIBC 206 (L) 250 - 450 ug/dL   Saturation Ratios 26 10.4 - 31.8 %   UIBC 153 ug/dL   Ferritin     Status: Abnormal   Collection Time: 01/24/17  3:54 AM  Result Value Ref Range   Ferritin 483 (H) 11 - 307 ng/mL  Reticulocytes     Status: Abnormal   Collection Time: 01/24/17  3:54 AM  Result Value Ref Range   Retic Ct Pct 1.7 0.4 - 3.1 %   RBC. 2.88 (L) 3.87 - 5.11 MIL/uL   Retic Count, Manual 49.0 19.0 - 186.0 K/uL  CBC     Status: Abnormal   Collection Time: 01/24/17  3:54 AM  Result Value Ref Range   WBC 33.6 (H) 4.0 - 10.5 K/uL   RBC 2.88 (L) 3.87 - 5.11 MIL/uL   Hemoglobin 9.3 (L) 12.0 - 15.0 g/dL   HCT 28.0 (L) 36.0 - 46.0 %   MCV 97.2 78.0 - 100.0 fL   MCH 32.3 26.0 - 34.0 pg   MCHC 33.2 30.0 - 36.0 g/dL   RDW 19.1 (H) 11.5 - 15.5 %   Platelets 59 (L) 150 - 400 K/uL    Comment: CONSISTENT WITH PREVIOUS RESULT  Basic metabolic panel     Status: Abnormal   Collection Time: 01/24/17  3:54 AM  Result Value Ref Range   Sodium 136 135 - 145 mmol/L   Potassium 4.2 3.5 - 5.1 mmol/L   Chloride 100 (L) 101 - 111 mmol/L   CO2 28 22 - 32 mmol/L   Glucose, Bld 149 (H) 65 - 99 mg/dL   BUN 14 6 - 20 mg/dL   Creatinine, Ser 0.98 0.44 - 1.00 mg/dL   Calcium 8.2 (L) 8.9 - 10.3 mg/dL   GFR calc non Af Amer 55 (L) >60 mL/min   GFR calc Af Amer >60 >60 mL/min    Comment: (NOTE) The eGFR has been calculated using the CKD EPI equation. This calculation has not been validated in all clinical situations. eGFR's persistently <60 mL/min signify possible Chronic Kidney Disease.    Anion gap 8 5 - 15    RADIOGRAPHIC STUDIES: I have personally reviewed the radiological images as listed and agreed with the findings in the report. Dg Chest 2 View  Result Date: 01/22/2017 CLINICAL DATA:  Acute onset of mid chest pain and pain between the scapula. Initial encounter. EXAM: CHEST  2 VIEW COMPARISON:  Chest radiograph from 01/29/2016 FINDINGS: The lungs are well-aerated. Minimal left basilar atelectasis is noted. There is no evidence of focal opacification, pleural effusion or  pneumothorax. The heart is borderline normal in size. A pacemaker is noted at the left chest wall, with leads ending at the right atrium and right ventricle. No acute osseous abnormalities are seen. Clips are noted within the right upper quadrant, reflecting prior cholecystectomy. A bone island is seen at the  proximal right humerus. IMPRESSION: Minimal left basilar atelectasis noted.  Lungs otherwise clear. Electronically Signed   By: Garald Balding M.D.   On: 01/22/2017 01:19   Mr Cervical Spine W Wo Contrast  Result Date: 01/22/2017 CLINICAL DATA:  76 y/o F; chronic back and neck pain with 1 week of right arm numbness and tingling also occasionally in the left arm. EXAM: MRI CERVICAL SPINE WITHOUT AND WITH CONTRAST MRI THORACIC SPINE WITHOUT AND WITH CONTRAST TECHNIQUE: Multiplanar and multiecho pulse sequences of the cervical spine and thoracic spine, to include the craniocervical junction thoracolumbar, and cervicothoracic junction, were obtained without and with intravenous contrast. CONTRAST:  78m MULTIHANCE GADOBENATE DIMEGLUMINE 529 MG/ML IV SOLN COMPARISON:  None. FINDINGS: Cervical spine MRI findings: Alignment: Straightening of cervical lordosis with slight reversal at the C5-6 level. No listhesis. Vertebrae: No fracture, evidence of discitis, or bone lesion. No abnormal enhancement. Cord: No abnormal cord signal appear no abnormal enhancement. Posterior Fossa, vertebral arteries, paraspinal tissues: Negative. Disc levels: C2-3: No significant disc displacement, foraminal narrowing, or canal stenosis. C3-4: No significant disc displacement, foraminal narrowing, or canal stenosis. C4-5: No significant disc displacement, foraminal narrowing, or canal stenosis. Mild facet hypertrophy. C5-6: Small disc osteophyte complex with bilateral uncovertebral and facet hypertrophy. Mild canal stenosis and left-sided foraminal narrowing. C6-7: Small disc osteophyte complex and bilateral uncovertebral/ facet  hypertrophy. Mild bilateral foraminal narrowing and canal stenosis. C7-T1: No significant disc displacement, foraminal narrowing, or canal stenosis. Thoracic spine MRI findings: Alignment: Physiologic. Vertebrae: No fracture, evidence of discitis, or bone lesion. No abnormal enhancement. Cord: Normal signal and morphology.  No abnormal enhancement. Posterior Fossa, vertebral arteries, paraspinal tissues: Negative. Disc levels: Small central disc protrusions at the T3-4, T4-5, T5-6, T6-7, T7-8 levels. Protrusions at T5-6, T6-7, and T7-8 contact the anterior cord with mild cord flattening, this is most pronounced at T7-8. No significant foraminal narrowing or canal stenosis. IMPRESSION: 1. No acute osseous abnormality or abnormal enhancement of the cervical and thoracic spine. 2. No abnormal cord signal or enhancement. 3. Mild cervical spondylosis predominantly at the C5-6 and C6-7 levels were disc and facet degenerative changes result in mild foraminal narrowing. No significant cervical canal stenosis. 4. Multiple small thoracic spine central disc protrusions. Protrusions at T5-6, T6-7, and T7-8 contact the anterior cord with mild cord flattening, this is most pronounced at T7-8. 5. No significant thoracic spine foraminal or canal stenosis. Electronically Signed   By: LKristine GarbeM.D.   On: 01/22/2017 20:43   Mr Thoracic Spine W Wo Contrast  Result Date: 01/22/2017 CLINICAL DATA:  76y/o F; chronic back and neck pain with 1 week of right arm numbness and tingling also occasionally in the left arm. EXAM: MRI CERVICAL SPINE WITHOUT AND WITH CONTRAST MRI THORACIC SPINE WITHOUT AND WITH CONTRAST TECHNIQUE: Multiplanar and multiecho pulse sequences of the cervical spine and thoracic spine, to include the craniocervical junction thoracolumbar, and cervicothoracic junction, were obtained without and with intravenous contrast. CONTRAST:  15mMULTIHANCE GADOBENATE DIMEGLUMINE 529 MG/ML IV SOLN COMPARISON:   None. FINDINGS: Cervical spine MRI findings: Alignment: Straightening of cervical lordosis with slight reversal at the C5-6 level. No listhesis. Vertebrae: No fracture, evidence of discitis, or bone lesion. No abnormal enhancement. Cord: No abnormal cord signal appear no abnormal enhancement. Posterior Fossa, vertebral arteries, paraspinal tissues: Negative. Disc levels: C2-3: No significant disc displacement, foraminal narrowing, or canal stenosis. C3-4: No significant disc displacement, foraminal narrowing, or canal stenosis. C4-5: No significant disc displacement, foraminal narrowing, or canal stenosis.  Mild facet hypertrophy. C5-6: Small disc osteophyte complex with bilateral uncovertebral and facet hypertrophy. Mild canal stenosis and left-sided foraminal narrowing. C6-7: Small disc osteophyte complex and bilateral uncovertebral/ facet hypertrophy. Mild bilateral foraminal narrowing and canal stenosis. C7-T1: No significant disc displacement, foraminal narrowing, or canal stenosis. Thoracic spine MRI findings: Alignment: Physiologic. Vertebrae: No fracture, evidence of discitis, or bone lesion. No abnormal enhancement. Cord: Normal signal and morphology.  No abnormal enhancement. Posterior Fossa, vertebral arteries, paraspinal tissues: Negative. Disc levels: Small central disc protrusions at the T3-4, T4-5, T5-6, T6-7, T7-8 levels. Protrusions at T5-6, T6-7, and T7-8 contact the anterior cord with mild cord flattening, this is most pronounced at T7-8. No significant foraminal narrowing or canal stenosis. IMPRESSION: 1. No acute osseous abnormality or abnormal enhancement of the cervical and thoracic spine. 2. No abnormal cord signal or enhancement. 3. Mild cervical spondylosis predominantly at the C5-6 and C6-7 levels were disc and facet degenerative changes result in mild foraminal narrowing. No significant cervical canal stenosis. 4. Multiple small thoracic spine central disc protrusions. Protrusions at T5-6,  T6-7, and T7-8 contact the anterior cord with mild cord flattening, this is most pronounced at T7-8. 5. No significant thoracic spine foraminal or canal stenosis. Electronically Signed   By: Mitzi Hansen M.D.   On: 01/22/2017 20:43   Ct Angio Chest/abd/pel For Dissection W And/or Wo Contrast  Result Date: 01/22/2017 CLINICAL DATA:  Midsternal chest pain radiating into both upper extremities and to the back. Nausea. Recent hospital discharge after full cardiac workup for pain. EXAM: CT ANGIOGRAPHY CHEST, ABDOMEN AND PELVIS TECHNIQUE: Multidetector CT imaging through the chest, abdomen and pelvis was performed using the standard protocol during bolus administration of intravenous contrast. Multiplanar reconstructed images and MIPs were obtained and reviewed to evaluate the vascular anatomy. CONTRAST:  100 mL Isovue 370 intravenous COMPARISON:  Radiographs 01/22/2017 FINDINGS: CTA CHEST FINDINGS Cardiovascular: Preferential opacification of the thoracic aorta. The aorta is normal in caliber with moderate atherosclerotic plaque but no aneurysm or dissection. Proximal great vessels are patent. Pulmonary arterial vasculature is also well opacified with no evidence of embolism. Moderate coronary artery calcified plaque. Mediastinum/Nodes: No enlarged mediastinal, hilar, or axillary lymph nodes. Thyroid gland, trachea, and esophagus demonstrate no significant findings. Lungs/Pleura: Lungs are clear. No pleural effusion or pneumothorax. Musculoskeletal: No chest wall abnormality. No acute or significant osseous findings. Review of the MIP images confirms the above findings. CTA ABDOMEN AND PELVIS FINDINGS VASCULAR Aorta: Normal caliber abdominal aorta with moderate atherosclerotic plaque. No dissection or significant stenosis. Celiac: Patent without evidence of aneurysm, dissection, vasculitis or significant stenosis. SMA: Patent without evidence of aneurysm, dissection, vasculitis or significant stenosis.  Renals: Accessory renal artery on the left. The renal arteries are patent without evidence of aneurysm, dissection, vasculitis, fibromuscular dysplasia or significant stenosis. IMA: Patent without evidence of aneurysm, dissection, vasculitis or significant stenosis. Inflow: Patent without evidence of aneurysm, dissection, vasculitis or significant stenosis.Generous atherosclerotic calcification. Veins: No obvious venous abnormality within the limitations of this arterial phase study. Review of the MIP images confirms the above findings. NON-VASCULAR Hepatobiliary: No focal liver abnormality is seen. Status post cholecystectomy. No biliary dilatation. Pancreas: Unremarkable. No pancreatic ductal dilatation or surrounding inflammatory changes. Spleen: Normal in size without focal abnormality. Adrenals/Urinary Tract: Adrenal glands are unremarkable. Kidneys are normal, without renal calculi, focal lesion, or hydronephrosis. Bladder is unremarkable. Stomach/Bowel: Hiatal hernia. Stomach and small bowel appear otherwise unremarkable. Colon is unremarkable. Lymphatic: No pathologic adenopathy in the abdomen or pelvis. Reproductive: Status post  hysterectomy. No adnexal masses. Other: No acute findings.  No ascites. Musculoskeletal: No significant skeletal lesion. Review of the MIP images confirms the above findings. IMPRESSION: 1. Normal caliber aorta with moderate atherosclerotic plaque. No evidence of dissection, aneurysm or significant stenosis. 2. No acute findings are evident in the chest, abdomen or pelvis. 3. Hiatal hernia. Electronically Signed   By: Andreas Newport M.D.   On: 01/22/2017 03:56   I reviewed her peripheral blood smear which showed abundant large lymphocytes with vacoules. Also noted absolute thrombocytopenia with no platelet clumping. Noted nucleated rbc. ASSESSMENT & PLAN  Leukocytosis, predominant lymphocytosis Frequent night sweats Overall smear is not consistent with CLL I am concerned  about bone marrow malignancy given rapid change in CBC I recommend bone marrow biopsy and she agreed.  I ordered CT biopsy for Monday  Anemia and thrombocytopenia I suspect due to bone marrow infiltration CT angiogram showed no evidence of lymphadenopathy Nutritional/vitamin studies are adequate. No reticulocytosis Will check LDH. No signs of hemolysis No need to transfuse unless bleeding, or hemoglobin < 8 or platelet < 10,000  Chest pain Defer to cardiologist Medical management Hold anticoagulation therapy and consider holding antiplatelet agents if platelet count drops to less than 50,000  Discharge planning Not ready for discharge I will return to check on her on Wednesday once bone marrow biopsy result is available    All questions were answered.    Heath Lark, MD 01/24/2017 9:38 AM

## 2017-01-24 NOTE — Progress Notes (Signed)
RN called into room because pt reported that she was very hot.  Checked pt VS, they are charted in epic.  Gave pt cold washcloth and a fan, and she reports that she feels better.  Will continue to monitor.

## 2017-01-24 NOTE — Progress Notes (Signed)
PT states that her IV site is painful to touch, and RN noted it is also pink. Placed order for IV team consult, because they placed this IV yesterday.

## 2017-01-24 NOTE — Progress Notes (Signed)
Progress Note  Patient Name: Sharon Pineda Date of Encounter: 01/24/2017  Primary Cardiologist: New (Dr. Ellyn Hack)   Subjective   She continues to have discomfort across her chest shoulders and back.  It is positional.    Inpatient Medications    Scheduled Meds: . allopurinol  100 mg Oral BID  . levothyroxine  88 mcg Oral QAC breakfast  . lisinopril  10 mg Oral Daily   Continuous Infusions:  PRN Meds: HYDROcodone-acetaminophen, morphine injection, ondansetron **OR** ondansetron (ZOFRAN) IV   Vital Signs    Vitals:   01/23/17 1200 01/23/17 2053 01/24/17 0450 01/24/17 0828  BP: (!) 114/48 (!) 97/45 (!) 114/43 (!) 108/46  Pulse: 85 91 87 90  Resp: _0 Temp: 98.6 F (37 C) 99 F (37.2 C) 98.6 F (37 C) 99.3 F (37.4 C)  TempSrc: Oral Oral Oral Oral  SpO2: 90% 91% 98% 97%  Weight:   182 lb 11.2 oz (82.9 kg)   Height:        Intake/Output Summary (Last 24 hours) at 01/24/17 1253 Last data filed at 01/24/17 0900  Gross per 24 hour  Intake              600 ml  Output              600 ml  Net                0 ml   Filed Weights   01/22/17 1430 01/23/17 0605 01/24/17 0450  Weight: 184 lb 3.2 oz (83.6 kg) 183 lb 4.8 oz (83.1 kg) 182 lb 11.2 oz (82.9 kg)    Telemetry    Paced - Personally Reviewed  ECG    NA - Personally Reviewed  Physical Exam   GEN: No acute distress.   Neck: No JVD Cardiac: RRR, no murmurs, rubs, or gallops.  Respiratory: Clear to auscultation bilaterally. GI: Soft, nontender, non-distended  MS: No edema; No deformity. Neuro:  Nonfocal  Psych: Normal affect   Labs    Chemistry Recent Labs Lab 01/22/17 0050 01/23/17 0530 01/24/17 0354  NA 134* 136 136  K 4.0 4.0 4.2  CL 101 102 100*  CO2 21* 26 28  GLUCOSE 183* 141* 149*  BUN _1 CREATININE 0.81 0.90 0.98  CALCIUM 8.6* 8.2* 8.2*  PROT  --  5.8*  --   ALBUMIN  --  2.8*  --   AST  --  18  --   ALT  --  13*  --   ALKPHOS  --  51  --   BILITOT  --  0.7  --    GFRNONAA >60 >60 55*  GFRAA >60 >60 >60  ANIONGAP _2 Hematology Recent Labs Lab 01/22/17 0050 01/23/17 0530 01/24/17 0354  WBC 27.3* 29.3* 33.6*  RBC 3.31* 2.94* 2.88*  2.88*  HGB 10.5* 9.3* 9.3*  HCT 31.3* 28.3* 28.0*  MCV 94.6 96.3 97.2  MCH 31.7 31.6 32.3  MCHC 33.5 32.9 33.2  RDW 18.4* 18.9* 19.1*  PLT 79* 68* 59*    Cardiac Enzymes Recent Labs Lab 01/22/17 0535 01/22/17 1023 01/22/17 1558  TROPONINI 0.19* 0.20* 0.18*    Recent Labs Lab 01/22/17 0101 01/22/17 0509  TROPIPOC 0.06 0.11*     BNPNo results for input(s): BNP, PROBNP in the last 168 hours.   DDimer No results for input(s): DDIMER in the last 168 hours.   Radiology    Mr  Cervical Spine W Wo Contrast  Result Date: 01/22/2017 CLINICAL DATA:  76 y/o F; chronic back and neck pain with 1 week of right arm numbness and tingling also occasionally in the left arm. EXAM: MRI CERVICAL SPINE WITHOUT AND WITH CONTRAST MRI THORACIC SPINE WITHOUT AND WITH CONTRAST TECHNIQUE: Multiplanar and multiecho pulse sequences of the cervical spine and thoracic spine, to include the craniocervical junction thoracolumbar, and cervicothoracic junction, were obtained without and with intravenous contrast. CONTRAST:  63m MULTIHANCE GADOBENATE DIMEGLUMINE 529 MG/ML IV SOLN COMPARISON:  None. FINDINGS: Cervical spine MRI findings: Alignment: Straightening of cervical lordosis with slight reversal at the C5-6 level. No listhesis. Vertebrae: No fracture, evidence of discitis, or bone lesion. No abnormal enhancement. Cord: No abnormal cord signal appear no abnormal enhancement. Posterior Fossa, vertebral arteries, paraspinal tissues: Negative. Disc levels: C2-3: No significant disc displacement, foraminal narrowing, or canal stenosis. C3-4: No significant disc displacement, foraminal narrowing, or canal stenosis. C4-5: No significant disc displacement, foraminal narrowing, or canal stenosis. Mild facet hypertrophy. C5-6: Small  disc osteophyte complex with bilateral uncovertebral and facet hypertrophy. Mild canal stenosis and left-sided foraminal narrowing. C6-7: Small disc osteophyte complex and bilateral uncovertebral/ facet hypertrophy. Mild bilateral foraminal narrowing and canal stenosis. C7-T1: No significant disc displacement, foraminal narrowing, or canal stenosis. Thoracic spine MRI findings: Alignment: Physiologic. Vertebrae: No fracture, evidence of discitis, or bone lesion. No abnormal enhancement. Cord: Normal signal and morphology.  No abnormal enhancement. Posterior Fossa, vertebral arteries, paraspinal tissues: Negative. Disc levels: Small central disc protrusions at the T3-4, T4-5, T5-6, T6-7, T7-8 levels. Protrusions at T5-6, T6-7, and T7-8 contact the anterior cord with mild cord flattening, this is most pronounced at T7-8. No significant foraminal narrowing or canal stenosis. IMPRESSION: 1. No acute osseous abnormality or abnormal enhancement of the cervical and thoracic spine. 2. No abnormal cord signal or enhancement. 3. Mild cervical spondylosis predominantly at the C5-6 and C6-7 levels were disc and facet degenerative changes result in mild foraminal narrowing. No significant cervical canal stenosis. 4. Multiple small thoracic spine central disc protrusions. Protrusions at T5-6, T6-7, and T7-8 contact the anterior cord with mild cord flattening, this is most pronounced at T7-8. 5. No significant thoracic spine foraminal or canal stenosis. Electronically Signed   By: LKristine GarbeM.D.   On: 01/22/2017 20:43   Mr Thoracic Spine W Wo Contrast  Result Date: 01/22/2017 CLINICAL DATA:  76 y/o F; chronic back and neck pain with 1 week of right arm numbness and tingling also occasionally in the left arm. EXAM: MRI CERVICAL SPINE WITHOUT AND WITH CONTRAST MRI THORACIC SPINE WITHOUT AND WITH CONTRAST TECHNIQUE: Multiplanar and multiecho pulse sequences of the cervical spine and thoracic spine, to include the  craniocervical junction thoracolumbar, and cervicothoracic junction, were obtained without and with intravenous contrast. CONTRAST:  127mMULTIHANCE GADOBENATE DIMEGLUMINE 529 MG/ML IV SOLN COMPARISON:  None. FINDINGS: Cervical spine MRI findings: Alignment: Straightening of cervical lordosis with slight reversal at the C5-6 level. No listhesis. Vertebrae: No fracture, evidence of discitis, or bone lesion. No abnormal enhancement. Cord: No abnormal cord signal appear no abnormal enhancement. Posterior Fossa, vertebral arteries, paraspinal tissues: Negative. Disc levels: C2-3: No significant disc displacement, foraminal narrowing, or canal stenosis. C3-4: No significant disc displacement, foraminal narrowing, or canal stenosis. C4-5: No significant disc displacement, foraminal narrowing, or canal stenosis. Mild facet hypertrophy. C5-6: Small disc osteophyte complex with bilateral uncovertebral and facet hypertrophy. Mild canal stenosis and left-sided foraminal narrowing. C6-7: Small disc osteophyte complex and bilateral uncovertebral/ facet  hypertrophy. Mild bilateral foraminal narrowing and canal stenosis. C7-T1: No significant disc displacement, foraminal narrowing, or canal stenosis. Thoracic spine MRI findings: Alignment: Physiologic. Vertebrae: No fracture, evidence of discitis, or bone lesion. No abnormal enhancement. Cord: Normal signal and morphology.  No abnormal enhancement. Posterior Fossa, vertebral arteries, paraspinal tissues: Negative. Disc levels: Small central disc protrusions at the T3-4, T4-5, T5-6, T6-7, T7-8 levels. Protrusions at T5-6, T6-7, and T7-8 contact the anterior cord with mild cord flattening, this is most pronounced at T7-8. No significant foraminal narrowing or canal stenosis. IMPRESSION: 1. No acute osseous abnormality or abnormal enhancement of the cervical and thoracic spine. 2. No abnormal cord signal or enhancement. 3. Mild cervical spondylosis predominantly at the C5-6 and C6-7  levels were disc and facet degenerative changes result in mild foraminal narrowing. No significant cervical canal stenosis. 4. Multiple small thoracic spine central disc protrusions. Protrusions at T5-6, T6-7, and T7-8 contact the anterior cord with mild cord flattening, this is most pronounced at T7-8. 5. No significant thoracic spine foraminal or canal stenosis. Electronically Signed   By: Kristine Garbe M.D.   On: 01/22/2017 20:43    Cardiac Studies   ECHO:  - Left ventricle: The cavity size was normal. There was moderate   concentric hypertrophy. Systolic function was normal. The   estimated ejection fraction was in the range of 55% to 60%.   Cannot exclude hypokinesis of the apicalinferior myocardium.   Features are consistent with a pseudonormal left ventricular   filling pattern, with concomitant abnormal relaxation and   increased filling pressure (grade 2 diastolic dysfunction).   Doppler parameters are consistent with high ventricular filling   pressure. - Aortic valve: Moderately calcified annulus. Trileaflet; normal   thickness, moderately calcified leaflets. - Mitral valve: Calcified annulus. There was mild regurgitation. - Left atrium: The atrium was mildly dilated. - Pericardium, extracardiac: A small, free-flowing pericardial   effusion was identified along the right ventricular free wall and   along the right atrial free wall. The fluid had no internal   echoes. There was mildright atrial chamber collapse for less than   50% of the cardiac cycle. There was evidence for mildly increased   RV-LV interaction demonstrated by respirophasic changes in   transmitral velocities.  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  Assessment & Plan    CHEST PAIN:  Etiology is not clear.  Not thought to be ischemia.    Troponin elevation was non diagnostic.  Given her continued pain and the small effusion on her echo I will start Motrin and colchicine for possible pericarditis.    I do not suspect an ischemic etiology.   LEUKOCYTOSIS:  Oncology is evaluating and she is to have a bone marrow biopsy.     Signed, Minus Breeding, MD  01/24/2017, 12:53 PM

## 2017-01-24 NOTE — Progress Notes (Signed)
PROGRESS NOTE    Sharon Pineda  ZJQ:[id number] DOB: 17-Mar-1941 DOA: 01/22/2017  PCP: Lanier Clam, MD   HPI: Pt is 76 y.o. female with known CAD, type 2 mobitz with pacer, HTN, thryoid CA s/p thyroidectomy, OSA on CPAP, who presented with 1 week duration of intermittent mid area chest pain that is occasionally but not consistently radiating to her right shoulder, 7/10 in severity when present and associated with upper area back pain. No specific alleviating factors but occasionally worse with movement. Pt went to [facility] 4 days prior to this admission (PTA) for this, was admitted, underwent LHC that showed an affected diagonal artery only, had a echo that was normal, had carotids that were normal, and was discharged one day PTA without a clear diagnosis. Cardiology was consulted here at [facility] for assistance as troponins remains mildly elevated. Telemetry bed requested.  Assessment and Plan:  1. Back pain and chest pain:  - unclear etiology at this time  - MRI C and T spine with and without contrast ordered but with no specific etiology that would explain this  - cardiology consulted for assistance but doubt more can be offered at this time as pt already had cardiac cath  - provide analgesia as needed  - motrin started, will see if that will help   2. Anemia, thrombocytopenia and leukocytosis - path smear worrisome for CLL - oncology consulted, plan for bone marrow biopsy   3. Hypothyroidism - Continue levothyroxine  4. Hypertension, essential  - Continue lisinopril  5. Chronic pain - Continue hydrocodone  DVT prophylaxis: SCD's Code Status: Full  Family Communication: Patient and family at bedside  Disposition Plan: to be determined, needs bone marrow biopsy on Monday   Consultants:  Cardiology  Oncology   Procedures:   None  Antimicrobials:   None  Subjective: Still with mostly right upper chest pain and back pain.   Objective: Vitals:   01/23/17 2053 01/24/17 0450 01/24/17 0828 01/24/17 1404  BP: (!) 97/45 (!) 114/43 (!) 108/46 (!) 100/39  Pulse: 91 87 90 (!) 101  Resp: 18 18 20 20   Temp: 99 F (37.2 C) 98.6 F (37 C) 99.3 F (37.4 C) 99.5 F (37.5 C)  TempSrc: Oral Oral Oral Oral  SpO2: 91% 98% 97% 94%  Weight:  82.9 kg (182 lb 11.2 oz)    Height:        Intake/Output Summary (Last 24 hours) at 01/24/17 1432 Last data filed at 01/24/17 1300  Gross per 24 hour  Intake              840 ml  Output              900 ml  Net              -60 ml   Filed Weights   01/22/17 1430 01/23/17 0605 01/24/17 0450  Weight: 83.6 kg (184 lb 3.2 oz) 83.1 kg (183 lb 4.8 oz) 82.9 kg (182 lb 11.2 oz)    Examination:  General exam: Appears calm and comfortable  Respiratory system: Clear to auscultation. Respiratory effort normal. Cardiovascular system: S1 & S2 heard, RRR. No JVD, rubs, gallops or clicks. No pedal edema. Gastrointestinal system: Abdomen is nondistended, soft and nontender. No organomegaly or masses felt.  Central nervous system: Alert and oriented. No focal neurological deficits. Extremities: Symmetric 5 x 5 power.  Data Reviewed: I have personally reviewed following labs and imaging studies  CBC:  Recent Labs Lab 01/22/17  0050 01/23/17 0530 01/24/17 0354  WBC 27.3* 29.3* 33.6*  NEUTROABS 1.1*  --   --   HGB 10.5* 9.3* 9.3*  HCT 31.3* 28.3* 28.0*  MCV 94.6 96.3 97.2  PLT 79* 68* 59*   Basic Metabolic Panel:  Recent Labs Lab 01/22/17 0050 01/23/17 0530 01/24/17 0354  NA 134* 136 136  K 4.0 4.0 4.2  CL 101 102 100*  CO2 21* 26 28  GLUCOSE 183* 141* 149*  BUN 11 12 14   CREATININE 0.81 0.90 0.98  CALCIUM 8.6* 8.2* 8.2*   Liver Function Tests:  Recent Labs Lab 01/23/17 0530  AST 18  ALT 13*  ALKPHOS 51  BILITOT 0.7  PROT 5.8*  ALBUMIN 2.8*    Recent Labs Lab 01/22/17 0453  LIPASE 21   Cardiac Enzymes:  Recent Labs Lab 01/22/17 0535 01/22/17 1023 01/22/17 1558    TROPONINI 0.19* 0.20* 0.18*   Urine analysis:    Component Value Date/Time   COLORURINE YELLOW 01/22/2017 0309   APPEARANCEUR TURBID (A) 01/22/2017 0309   LABSPEC 1.030 01/22/2017 0309   PHURINE 5.5 01/22/2017 0309   GLUCOSEU NEGATIVE 01/22/2017 0309   HGBUR LARGE (A) 01/22/2017 0309   BILIRUBINUR NEGATIVE 01/22/2017 0309   KETONESUR NEGATIVE 01/22/2017 0309   PROTEINUR 100 (A) 01/22/2017 0309   NITRITE NEGATIVE 01/22/2017 0309   LEUKOCYTESUR NEGATIVE 01/22/2017 0309   Recent Results (from the past 240 hour(s))  Blood culture (routine x 2)     Status: None (Preliminary result)   Collection Time: 01/22/17  4:58 AM  Result Value Ref Range Status   Specimen Description BLOOD RIGHT HAND  Final   Special Requests BOTTLES DRAWN AEROBIC AND ANAEROBIC 5ML  Final   Culture NO GROWTH 2 DAYS  Final   Report Status PENDING  Incomplete  Blood culture (routine x 2)     Status: None (Preliminary result)   Collection Time: 01/22/17  5:00 AM  Result Value Ref Range Status   Specimen Description BLOOD LEFT ARM  Final   Special Requests BOTTLES DRAWN AEROBIC AND ANAEROBIC 5ML  Final   Culture NO GROWTH 2 DAYS  Final   Report Status PENDING  Incomplete      Radiology Studies: Mr Cervical Spine W Wo Contrast  Result Date: 01/22/2017 CLINICAL DATA:  76 y/o F; chronic back and neck pain with 1 week of right arm numbness and tingling also occasionally in the left arm. EXAM: MRI CERVICAL SPINE WITHOUT AND WITH CONTRAST MRI THORACIC SPINE WITHOUT AND WITH CONTRAST TECHNIQUE: Multiplanar and multiecho pulse sequences of the cervical spine and thoracic spine, to include the craniocervical junction thoracolumbar, and cervicothoracic junction, were obtained without and with intravenous contrast. CONTRAST:  24m MULTIHANCE GADOBENATE DIMEGLUMINE 529 MG/ML IV SOLN COMPARISON:  None. FINDINGS: Cervical spine MRI findings: Alignment: Straightening of cervical lordosis with slight reversal at the C5-6 level. No  listhesis. Vertebrae: No fracture, evidence of discitis, or bone lesion. No abnormal enhancement. Cord: No abnormal cord signal appear no abnormal enhancement. Posterior Fossa, vertebral arteries, paraspinal tissues: Negative. Disc levels: C2-3: No significant disc displacement, foraminal narrowing, or canal stenosis. C3-4: No significant disc displacement, foraminal narrowing, or canal stenosis. C4-5: No significant disc displacement, foraminal narrowing, or canal stenosis. Mild facet hypertrophy. C5-6: Small disc osteophyte complex with bilateral uncovertebral and facet hypertrophy. Mild canal stenosis and left-sided foraminal narrowing. C6-7: Small disc osteophyte complex and bilateral uncovertebral/ facet hypertrophy. Mild bilateral foraminal narrowing and canal stenosis. C7-T1: No significant disc displacement, foraminal narrowing, or canal  stenosis. Thoracic spine MRI findings: Alignment: Physiologic. Vertebrae: No fracture, evidence of discitis, or bone lesion. No abnormal enhancement. Cord: Normal signal and morphology.  No abnormal enhancement. Posterior Fossa, vertebral arteries, paraspinal tissues: Negative. Disc levels: Small central disc protrusions at the T3-4, T4-5, T5-6, T6-7, T7-8 levels. Protrusions at T5-6, T6-7, and T7-8 contact the anterior cord with mild cord flattening, this is most pronounced at T7-8. No significant foraminal narrowing or canal stenosis. IMPRESSION: 1. No acute osseous abnormality or abnormal enhancement of the cervical and thoracic spine. 2. No abnormal cord signal or enhancement. 3. Mild cervical spondylosis predominantly at the C5-6 and C6-7 levels were disc and facet degenerative changes result in mild foraminal narrowing. No significant cervical canal stenosis. 4. Multiple small thoracic spine central disc protrusions. Protrusions at T5-6, T6-7, and T7-8 contact the anterior cord with mild cord flattening, this is most pronounced at T7-8. 5. No significant thoracic spine  foraminal or canal stenosis. Electronically Signed   By: Kristine Garbe M.D.   On: 01/22/2017 20:43   Mr Thoracic Spine W Wo Contrast  Result Date: 01/22/2017 CLINICAL DATA:  76 y/o F; chronic back and neck pain with 1 week of right arm numbness and tingling also occasionally in the left arm. EXAM: MRI CERVICAL SPINE WITHOUT AND WITH CONTRAST MRI THORACIC SPINE WITHOUT AND WITH CONTRAST TECHNIQUE: Multiplanar and multiecho pulse sequences of the cervical spine and thoracic spine, to include the craniocervical junction thoracolumbar, and cervicothoracic junction, were obtained without and with intravenous contrast. CONTRAST:  58m MULTIHANCE GADOBENATE DIMEGLUMINE 529 MG/ML IV SOLN COMPARISON:  None. FINDINGS: Cervical spine MRI findings: Alignment: Straightening of cervical lordosis with slight reversal at the C5-6 level. No listhesis. Vertebrae: No fracture, evidence of discitis, or bone lesion. No abnormal enhancement. Cord: No abnormal cord signal appear no abnormal enhancement. Posterior Fossa, vertebral arteries, paraspinal tissues: Negative. Disc levels: C2-3: No significant disc displacement, foraminal narrowing, or canal stenosis. C3-4: No significant disc displacement, foraminal narrowing, or canal stenosis. C4-5: No significant disc displacement, foraminal narrowing, or canal stenosis. Mild facet hypertrophy. C5-6: Small disc osteophyte complex with bilateral uncovertebral and facet hypertrophy. Mild canal stenosis and left-sided foraminal narrowing. C6-7: Small disc osteophyte complex and bilateral uncovertebral/ facet hypertrophy. Mild bilateral foraminal narrowing and canal stenosis. C7-T1: No significant disc displacement, foraminal narrowing, or canal stenosis. Thoracic spine MRI findings: Alignment: Physiologic. Vertebrae: No fracture, evidence of discitis, or bone lesion. No abnormal enhancement. Cord: Normal signal and morphology.  No abnormal enhancement. Posterior Fossa, vertebral  arteries, paraspinal tissues: Negative. Disc levels: Small central disc protrusions at the T3-4, T4-5, T5-6, T6-7, T7-8 levels. Protrusions at T5-6, T6-7, and T7-8 contact the anterior cord with mild cord flattening, this is most pronounced at T7-8. No significant foraminal narrowing or canal stenosis. IMPRESSION: 1. No acute osseous abnormality or abnormal enhancement of the cervical and thoracic spine. 2. No abnormal cord signal or enhancement. 3. Mild cervical spondylosis predominantly at the C5-6 and C6-7 levels were disc and facet degenerative changes result in mild foraminal narrowing. No significant cervical canal stenosis. 4. Multiple small thoracic spine central disc protrusions. Protrusions at T5-6, T6-7, and T7-8 contact the anterior cord with mild cord flattening, this is most pronounced at T7-8. 5. No significant thoracic spine foraminal or canal stenosis. Electronically Signed   By: LKristine GarbeM.D.   On: 01/22/2017 20:43   Scheduled Meds: . allopurinol  100 mg Oral BID  . colchicine  0.6 mg Oral BID  . ibuprofen  800 mg  Oral TID  . levothyroxine  88 mcg Oral QAC breakfast  . lisinopril  10 mg Oral Daily   Continuous Infusions:   LOS: 0 days   Time spent: 20 minutes   Faye Ramsay, MD [facility] Pager [phone]  If 7PM-7AM, please contact night-coverage [email or web address] Password [email or web address] 01/24/2017, 2:32 PM

## 2017-01-25 LAB — CBC
HCT: 26.4 % — ABNORMAL LOW (ref 36.0–46.0)
HEMOGLOBIN: 8.6 g/dL — AB (ref 12.0–15.0)
MCH: 31.7 pg (ref 26.0–34.0)
MCHC: 32.6 g/dL (ref 30.0–36.0)
MCV: 97.4 fL (ref 78.0–100.0)
PLATELETS: 54 10*3/uL — AB (ref 150–400)
RBC: 2.71 MIL/uL — ABNORMAL LOW (ref 3.87–5.11)
RDW: 19.5 % — ABNORMAL HIGH (ref 11.5–15.5)
WBC: 42.7 10*3/uL — ABNORMAL HIGH (ref 4.0–10.5)

## 2017-01-25 LAB — BASIC METABOLIC PANEL
Anion gap: 8 (ref 5–15)
BUN: 26 mg/dL — ABNORMAL HIGH (ref 6–20)
CHLORIDE: 100 mmol/L — AB (ref 101–111)
CO2: 27 mmol/L (ref 22–32)
Calcium: 8 mg/dL — ABNORMAL LOW (ref 8.9–10.3)
Creatinine, Ser: 1.37 mg/dL — ABNORMAL HIGH (ref 0.44–1.00)
GFR calc Af Amer: 43 mL/min — ABNORMAL LOW (ref >60)
GFR calc non Af Amer: 37 mL/min — ABNORMAL LOW (ref >60)
Glucose, Bld: 141 mg/dL — ABNORMAL HIGH (ref 65–99)
Potassium: 4 mmol/L (ref 3.5–5.1)
SODIUM: 135 mmol/L (ref 135–145)

## 2017-01-25 MED ORDER — SENNOSIDES-DOCUSATE SODIUM 8.6-50 MG PO TABS
1.0000 | ORAL_TABLET | Freq: Two times a day (BID) | ORAL | Status: DC
  Administered 2017-01-25: 1 via ORAL
  Filled 2017-01-25 (×5): qty 1

## 2017-01-25 NOTE — Progress Notes (Signed)
Pt refused bed alarm on, educated on pt's safety plan. Will continue to do hourly rounding.

## 2017-01-25 NOTE — Progress Notes (Signed)
Progress Note  Patient Name: Sharon Pineda Date of Encounter: 01/25/2017  Primary Cardiologist: New (Dr. Ellyn Hack)   Subjective   She is scheduled for a bone marrow biopsy tomorrow.  She has less chest pain today.  She is fatigued.    Inpatient Medications    Scheduled Meds: . allopurinol  100 mg Oral BID  . colchicine  0.6 mg Oral BID  . ibuprofen  800 mg Oral TID  . levothyroxine  88 mcg Oral QAC breakfast  . lisinopril  10 mg Oral Daily  . senna-docusate  1 tablet Oral BID   Continuous Infusions:  PRN Meds: HYDROcodone-acetaminophen, morphine injection, ondansetron **OR** ondansetron (ZOFRAN) IV   Vital Signs    Vitals:   01/24/17 2038 01/24/17 2108 01/25/17 0444 01/25/17 0857  BP: (!) 91/37 (!) 91/43 (!) 102/41 (!) 101/32  Pulse: 74 78 81 90  Resp: _0 Temp: 98.5 F (36.9 C)  98.5 F (36.9 C) 98.7 F (37.1 C)  TempSrc: Oral  Oral Oral  SpO2: 95%  98% 100%  Weight:   183 lb (83 kg)   Height:        Intake/Output Summary (Last 24 hours) at 01/25/17 1144 Last data filed at 01/25/17 1100  Gross per 24 hour  Intake              600 ml  Output             1200 ml  Net             -600 ml   Filed Weights   01/23/17 0605 01/24/17 0450 01/25/17 0444  Weight: 183 lb 4.8 oz (83.1 kg) 182 lb 11.2 oz (82.9 kg) 183 lb (83 kg)    Telemetry    Paced - Personally Reviewed  ECG    NA - Personally Reviewed  Physical Exam   GEN: No acute distress.   Neck: No JVD  Cardiac: RRR, no murmurs, rubs, or gallops.  Respiratory: Clear to auscultation bilaterally. GI: Soft, nontender, non-distended  MS: No edema; No deformity. Neuro:  Nonfocal  Psych: Normal affect   Labs    Chemistry  Recent Labs Lab 01/23/17 0530 01/24/17 0354 01/25/17 0435  NA 136 136 135  K 4.0 4.2 4.0  CL 102 100* 100*  CO2 _1 GLUCOSE 141* 149* 141*  BUN 12 14 26*  CREATININE 0.90 0.98 1.37*  CALCIUM 8.2* 8.2* 8.0*  PROT 5.8*  --   --   ALBUMIN 2.8*  --   --     AST 18  --   --   ALT 13*  --   --   ALKPHOS 51  --   --   BILITOT 0.7  --   --   GFRNONAA >60 55* 37*  GFRAA >60 >60 43*  ANIONGAP _2 Hematology  Recent Labs Lab 01/23/17 0530 01/24/17 0354 01/25/17 0435  WBC 29.3* 33.6* 42.7*  RBC 2.94* 2.88*  2.88* 2.71*  HGB 9.3* 9.3* 8.6*  HCT 28.3* 28.0* 26.4*  MCV 96.3 97.2 97.4  MCH 31.6 32.3 31.7  MCHC 32.9 33.2 32.6  RDW 18.9* 19.1* 19.5*  PLT 68* 59* 54*    Cardiac Enzymes  Recent Labs Lab 01/22/17 0535 01/22/17 1023 01/22/17 1558  TROPONINI 0.19* 0.20* 0.18*     Recent Labs Lab 01/22/17 0101 01/22/17 0509  TROPIPOC 0.06 0.11*     BNPNo results for input(s): BNP, PROBNP in  the last 168 hours.   DDimer No results for input(s): DDIMER in the last 168 hours.   Radiology    No results found.  Cardiac Studies   ECHO:  - Left ventricle: The cavity size was normal. There was moderate   concentric hypertrophy. Systolic function was normal. The   estimated ejection fraction was in the range of 55% to 60%.   Cannot exclude hypokinesis of the apicalinferior myocardium.   Features are consistent with a pseudonormal left ventricular   filling pattern, with concomitant abnormal relaxation and   increased filling pressure (grade 2 diastolic dysfunction).   Doppler parameters are consistent with high ventricular filling   pressure. - Aortic valve: Moderately calcified annulus. Trileaflet; normal   thickness, moderately calcified leaflets. - Mitral valve: Calcified annulus. There was mild regurgitation. - Left atrium: The atrium was mildly dilated. - Pericardium, extracardiac: A small, free-flowing pericardial   effusion was identified along the right ventricular free wall and   along the right atrial free wall. The fluid had no internal   echoes. There was mildright atrial chamber collapse for less than   50% of the cardiac cycle. There was evidence for mildly increased   RV-LV interaction demonstrated by  respirophasic changes in   transmitral velocities.  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  Assessment & Plan    CHEST PAIN:  Etiology is not clear.  Not thought to be ischemia.    Troponin elevation was non diagnostic.  Given her continued pain and the small effusion on her echo I started her on Motrin and colchicine. I would like to hold her ACE inhibitor for a few days since her creat is increasing and her BP is low and she is on high dose NSAID.   Stop Motrin if creat goes up.   LEUKOCYTOSIS:  Oncology is evaluating and she is to have a bone marrow biopsy.     Signed, James Hochrein, MD  01/25/2017, 11:44 AM    

## 2017-01-25 NOTE — Consult Note (Signed)
Chief Complaint: Patient was seen in consultation today for CT-guided bone marrow biopsy Chief Complaint  Patient presents with  . Chest Pain    Referring Physician(s): [place]  Supervising Physician: Corrie Mckusick  Patient Status: [facility] - In-pt  History of Present Illness: Sharon Pineda is a 76 y.o. female with past medical history of CAD, fibromyalgia, obstructive sleep apnea, prior pacemaker placement, thyroid cancer with thyroidectomy, recently treated sinus infection who was admitted to the hospital on 2/1 with chest pain, night sweats, concern for pericarditis. Subsequent lab work revealed a leukocytosis with predominant lymphocytosis, anemia and platelets of unknown etiology. Request now received from oncology for CT-guided bone marrow biopsy for further evaluation.  Past Medical History:  Diagnosis Date  . Cancer [facility])    thyroid cancer; had received radioactive iodine before  . DDD (degenerative disc disease), thoracic   . Hyperlipidemia   . Hypertension   . Kidney stones   . Mobitz II   . Thyroid cancer ([place])    a. s/p thyroidecetomy    Past Surgical History:  Procedure Laterality Date  . ABDOMINAL HYSTERECTOMY    . CHOLECYSTECTOMY    . 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 JOINT REPLACEMENT    . THYROID SURGERY      Allergies: Dilaudid [hydromorphone hcl]; Penicillins; Sulfa antibiotics; and Tizanidine hcl  Medications: Prior to Admission medications   Medication Sig Start Date End Date Taking? Authorizing Provider  albuterol (PROVENTIL HFA;VENTOLIN HFA) 108 (90 Base) MCG/ACT inhaler Inhale 1 puff into the lungs every 6 (six) hours as needed for wheezing or shortness of breath.   Yes Historical Provider, MD  allopurinol (ZYLOPRIM) 100 MG tablet Take 100 mg by mouth 2 (two) times daily.   Yes Historical Provider, MD  HYDROcodone-acetaminophen (NORCO/VICODIN) 5-325 MG tablet Take 1  tablet by mouth every 6 (six) hours as needed for moderate pain.   Yes Historical Provider, MD  levothyroxine (SYNTHROID, LEVOTHROID) 88 MCG tablet Take 88 mcg by mouth daily before breakfast.   Yes Historical Provider, MD  lisinopril (PRINIVIL,ZESTRIL) 10 MG tablet Take 10 mg by mouth daily.   Yes Historical Provider, MD     Family History  Problem Relation Age of Onset  . Hypertension Mother   . Lung cancer Mother   . Hypertension Father   . Stomach cancer Father     Social History   Social History  . Marital status: Widowed    Spouse name: N/A  . Number of children: 5  . Years of education: N/A   Occupational History  . retired Quarry manager    Social History Main Topics  . Smoking status: Never Smoker  . Smokeless tobacco: Never Used  . Alcohol use No  . Drug use: No  . Sexual activity: Not Asked   Other Topics Concern  . None   Social History Narrative   [place]. Son with CHF and other medical problems lives with her     Review of Systems see above; currently denies fever, cough, abdominal pain, nausea, vomiting or abnormal bleeding. She does have some dyspnea with exertion, back/shoulder/chest/bilat upper extremity discomfort and occasional headaches.  Vital Signs: BP (!) 101/32 (BP Location: Left Arm)   Pulse 90   Temp 98.7 F (37.1 C) (Oral)   Resp 20   Ht _0  (1.676 m)   Wt 183 lb (83 kg) Comment: scale c  SpO2  100%   BMI 29.54 kg/m   Physical Exam awake, alert. Chest clear to auscultation bilaterally. Heart with regular rate and rhythm. Abdomen soft, positive bowel sounds, nontender. Lower extremities with no significant edema  Mallampati Score:     Imaging: Dg Chest 2 View  Result Date: 01/22/2017 CLINICAL DATA:  Acute onset of mid chest pain and pain between the scapula. Initial encounter. EXAM: CHEST  2 VIEW COMPARISON:  Chest radiograph from 01/29/2016 FINDINGS: The lungs are well-aerated. Minimal left basilar atelectasis is noted. There is  no evidence of focal opacification, pleural effusion or pneumothorax. The heart is borderline normal in size. A pacemaker is noted at the left chest wall, with leads ending at the right atrium and right ventricle. No acute osseous abnormalities are seen. Clips are noted within the right upper quadrant, reflecting prior cholecystectomy. A bone island is seen at the proximal right humerus. IMPRESSION: Minimal left basilar atelectasis noted.  Lungs otherwise clear. Electronically Signed   By: Garald Balding M.D.   On: 01/22/2017 01:19   Mr Cervical Spine W Wo Contrast  Result Date: 01/22/2017 CLINICAL DATA:  76 y/o F; chronic back and neck pain with 1 week of right arm numbness and tingling also occasionally in the left arm. EXAM: MRI CERVICAL SPINE WITHOUT AND WITH CONTRAST MRI THORACIC SPINE WITHOUT AND WITH CONTRAST TECHNIQUE: Multiplanar and multiecho pulse sequences of the cervical spine and thoracic spine, to include the craniocervical junction thoracolumbar, and cervicothoracic junction, were obtained without and with intravenous contrast. CONTRAST:  65m MULTIHANCE GADOBENATE DIMEGLUMINE 529 MG/ML IV SOLN COMPARISON:  None. FINDINGS: Cervical spine MRI findings: Alignment: Straightening of cervical lordosis with slight reversal at the C5-6 level. No listhesis. Vertebrae: No fracture, evidence of discitis, or bone lesion. No abnormal enhancement. Cord: No abnormal cord signal appear no abnormal enhancement. Posterior Fossa, vertebral arteries, paraspinal tissues: Negative. Disc levels: C2-3: No significant disc displacement, foraminal narrowing, or canal stenosis. C3-4: No significant disc displacement, foraminal narrowing, or canal stenosis. C4-5: No significant disc displacement, foraminal narrowing, or canal stenosis. Mild facet hypertrophy. C5-6: Small disc osteophyte complex with bilateral uncovertebral and facet hypertrophy. Mild canal stenosis and left-sided foraminal narrowing. C6-7: Small disc  osteophyte complex and bilateral uncovertebral/ facet hypertrophy. Mild bilateral foraminal narrowing and canal stenosis. C7-T1: No significant disc displacement, foraminal narrowing, or canal stenosis. Thoracic spine MRI findings: Alignment: Physiologic. Vertebrae: No fracture, evidence of discitis, or bone lesion. No abnormal enhancement. Cord: Normal signal and morphology.  No abnormal enhancement. Posterior Fossa, vertebral arteries, paraspinal tissues: Negative. Disc levels: Small central disc protrusions at the T3-4, T4-5, T5-6, T6-7, T7-8 levels. Protrusions at T5-6, T6-7, and T7-8 contact the anterior cord with mild cord flattening, this is most pronounced at T7-8. No significant foraminal narrowing or canal stenosis. IMPRESSION: 1. No acute osseous abnormality or abnormal enhancement of the cervical and thoracic spine. 2. No abnormal cord signal or enhancement. 3. Mild cervical spondylosis predominantly at the C5-6 and C6-7 levels were disc and facet degenerative changes result in mild foraminal narrowing. No significant cervical canal stenosis. 4. Multiple small thoracic spine central disc protrusions. Protrusions at T5-6, T6-7, and T7-8 contact the anterior cord with mild cord flattening, this is most pronounced at T7-8. 5. No significant thoracic spine foraminal or canal stenosis. Electronically Signed   By: LKristine GarbeM.D.   On: 01/22/2017 20:43   Mr Thoracic Spine W Wo Contrast  Result Date: 01/22/2017 CLINICAL DATA:  76y/o F; chronic back and neck pain with  1 week of right arm numbness and tingling also occasionally in the left arm. EXAM: MRI CERVICAL SPINE WITHOUT AND WITH CONTRAST MRI THORACIC SPINE WITHOUT AND WITH CONTRAST TECHNIQUE: Multiplanar and multiecho pulse sequences of the cervical spine and thoracic spine, to include the craniocervical junction thoracolumbar, and cervicothoracic junction, were obtained without and with intravenous contrast. CONTRAST:  47m MULTIHANCE  GADOBENATE DIMEGLUMINE 529 MG/ML IV SOLN COMPARISON:  None. FINDINGS: Cervical spine MRI findings: Alignment: Straightening of cervical lordosis with slight reversal at the C5-6 level. No listhesis. Vertebrae: No fracture, evidence of discitis, or bone lesion. No abnormal enhancement. Cord: No abnormal cord signal appear no abnormal enhancement. Posterior Fossa, vertebral arteries, paraspinal tissues: Negative. Disc levels: C2-3: No significant disc displacement, foraminal narrowing, or canal stenosis. C3-4: No significant disc displacement, foraminal narrowing, or canal stenosis. C4-5: No significant disc displacement, foraminal narrowing, or canal stenosis. Mild facet hypertrophy. C5-6: Small disc osteophyte complex with bilateral uncovertebral and facet hypertrophy. Mild canal stenosis and left-sided foraminal narrowing. C6-7: Small disc osteophyte complex and bilateral uncovertebral/ facet hypertrophy. Mild bilateral foraminal narrowing and canal stenosis. C7-T1: No significant disc displacement, foraminal narrowing, or canal stenosis. Thoracic spine MRI findings: Alignment: Physiologic. Vertebrae: No fracture, evidence of discitis, or bone lesion. No abnormal enhancement. Cord: Normal signal and morphology.  No abnormal enhancement. Posterior Fossa, vertebral arteries, paraspinal tissues: Negative. Disc levels: Small central disc protrusions at the T3-4, T4-5, T5-6, T6-7, T7-8 levels. Protrusions at T5-6, T6-7, and T7-8 contact the anterior cord with mild cord flattening, this is most pronounced at T7-8. No significant foraminal narrowing or canal stenosis. IMPRESSION: 1. No acute osseous abnormality or abnormal enhancement of the cervical and thoracic spine. 2. No abnormal cord signal or enhancement. 3. Mild cervical spondylosis predominantly at the C5-6 and C6-7 levels were disc and facet degenerative changes result in mild foraminal narrowing. No significant cervical canal stenosis. 4. Multiple small  thoracic spine central disc protrusions. Protrusions at T5-6, T6-7, and T7-8 contact the anterior cord with mild cord flattening, this is most pronounced at T7-8. 5. No significant thoracic spine foraminal or canal stenosis. Electronically Signed   By: LKristine GarbeM.D.   On: 01/22/2017 20:43   Ct Angio Chest/abd/pel For Dissection W And/or Wo Contrast  Result Date: 01/22/2017 CLINICAL DATA:  Midsternal chest pain radiating into both upper extremities and to the back. Nausea. Recent hospital discharge after full cardiac workup for pain. EXAM: CT ANGIOGRAPHY CHEST, ABDOMEN AND PELVIS TECHNIQUE: Multidetector CT imaging through the chest, abdomen and pelvis was performed using the standard protocol during bolus administration of intravenous contrast. Multiplanar reconstructed images and MIPs were obtained and reviewed to evaluate the vascular anatomy. CONTRAST:  100 mL Isovue 370 intravenous COMPARISON:  Radiographs 01/22/2017 FINDINGS: CTA CHEST FINDINGS Cardiovascular: Preferential opacification of the thoracic aorta. The aorta is normal in caliber with moderate atherosclerotic plaque but no aneurysm or dissection. Proximal great vessels are patent. Pulmonary arterial vasculature is also well opacified with no evidence of embolism. Moderate coronary artery calcified plaque. Mediastinum/Nodes: No enlarged mediastinal, hilar, or axillary lymph nodes. Thyroid gland, trachea, and esophagus demonstrate no significant findings. Lungs/Pleura: Lungs are clear. No pleural effusion or pneumothorax. Musculoskeletal: No chest wall abnormality. No acute or significant osseous findings. Review of the MIP images confirms the above findings. CTA ABDOMEN AND PELVIS FINDINGS VASCULAR Aorta: Normal caliber abdominal aorta with moderate atherosclerotic plaque. No dissection or significant stenosis. Celiac: Patent without evidence of aneurysm, dissection, vasculitis or significant stenosis. SMA: Patent without evidence of  aneurysm, dissection, vasculitis or significant stenosis. Renals: Accessory renal artery on the left. The renal arteries are patent without evidence of aneurysm, dissection, vasculitis, fibromuscular dysplasia or significant stenosis. IMA: Patent without evidence of aneurysm, dissection, vasculitis or significant stenosis. Inflow: Patent without evidence of aneurysm, dissection, vasculitis or significant stenosis.Generous atherosclerotic calcification. Veins: No obvious venous abnormality within the limitations of this arterial phase study. Review of the MIP images confirms the above findings. NON-VASCULAR Hepatobiliary: No focal liver abnormality is seen. Status post cholecystectomy. No biliary dilatation. Pancreas: Unremarkable. No pancreatic ductal dilatation or surrounding inflammatory changes. Spleen: Normal in size without focal abnormality. Adrenals/Urinary Tract: Adrenal glands are unremarkable. Kidneys are normal, without renal calculi, focal lesion, or hydronephrosis. Bladder is unremarkable. Stomach/Bowel: Hiatal hernia. Stomach and small bowel appear otherwise unremarkable. Colon is unremarkable. Lymphatic: No pathologic adenopathy in the abdomen or pelvis. Reproductive: Status post hysterectomy. No adnexal masses. Other: No acute findings.  No ascites. Musculoskeletal: No significant skeletal lesion. Review of the MIP images confirms the above findings. IMPRESSION: 1. Normal caliber aorta with moderate atherosclerotic plaque. No evidence of dissection, aneurysm or significant stenosis. 2. No acute findings are evident in the chest, abdomen or pelvis. 3. Hiatal hernia. Electronically Signed   By: Andreas Newport M.D.   On: 01/22/2017 03:56    Labs:  CBC:  Recent Labs  01/22/17 0050 01/23/17 0530 01/24/17 0354 01/25/17 0435  WBC 27.3* 29.3* 33.6* 42.7*  HGB 10.5* 9.3* 9.3* 8.6*  HCT 31.3* 28.3* 28.0* 26.4*  PLT 79* 68* 59* 54*    COAGS: No results for input(s): INR, APTT in the last  8760 hours.  BMP:  Recent Labs  01/22/17 0050 01/23/17 0530 01/24/17 0354 01/25/17 0435  NA 134* 136 136 135  K 4.0 4.0 4.2 4.0  CL 101 102 100* 100*  CO2 21* _0 GLUCOSE 183* 141* 149* 141*  BUN _1 26*  CALCIUM 8.6* 8.2* 8.2* 8.0*  CREATININE 0.81 0.90 0.98 1.37*  GFRNONAA >60 >60 55* 37*  GFRAA >60 >60 >60 43*    LIVER FUNCTION TESTS:  Recent Labs  01/23/17 0530  BILITOT 0.7  AST 18  ALT 13*  ALKPHOS 51  PROT 5.8*  ALBUMIN 2.8*    TUMOR MARKERS: No results for input(s): AFPTM, CEA, CA199, CHROMGRNA in the last 8760 hours.  Assessment and Plan: 76 y.o. female with past medical history of CAD, fibromyalgia, obstructive sleep apnea, degenerative disc disease, hypertension, hyperlipidemia, nephrolithiasis, prior pacemaker placement, thyroid cancer with thyroidectomy, recently treated sinus infection who was admitted to the hospital on 2/1 with chest pain, night sweats, concern for pericarditis. Subsequent lab work revealed a leukocytosis with predominant lymphocytosis, anemia and platelets of unknown etiology. Request now received from oncology for CT-guided bone marrow biopsy for further evaluation.Risks and benefits discussed with the patient including, but not limited to bleeding, infection, damage to adjacent structures or low yield requiring additional tests.All of the patient's questions were answered, patient is agreeable to proceed. Consent signed and in chart. Procedure tent scheduled for 2/5 AM.     Thank you for this interesting consult.  I greatly enjoyed meeting Sharon Pineda and look forward to participating in their care.  A copy of this report was sent to the requesting provider on this date.  Electronically Signed: D. Rowe Robert 01/25/2017, 9:47 AM   I spent a total of  25 minutes   in face to face in clinical consultation, greater than 50% of which was counseling/coordinating care  for CT-guided bone marrow biopsy

## 2017-01-25 NOTE — Progress Notes (Signed)
Pt slept well during the night.

## 2017-01-25 NOTE — Progress Notes (Addendum)
PROGRESS NOTE    Sharon Pineda  WSF:[id number] DOB: 15-Apr-1941 DOA: 01/22/2017  PCP: Lanier Clam, MD   HPI: Pt is 76 y.o. female with known CAD, type 2 mobitz with pacer, HTN, thryoid CA s/p thyroidectomy, OSA on CPAP, who presented with 1 week duration of intermittent mid area chest pain that is occasionally but not consistently radiating to her right shoulder, 7/10 in severity when present and associated with upper area back pain. No specific alleviating factors but occasionally worse with movement. Pt went to [facility] 4 days prior to this admission (PTA) for this, was admitted, underwent LHC that showed an affected diagonal artery only, had a echo that was normal, had carotids that were normal, and was discharged one day PTA without a clear diagnosis. Cardiology was consulted here at [facility] for assistance as troponins remains mildly elevated. Telemetry bed requested.  Assessment and Plan: Back pain and chest pain:  - unclear etiology at this time  - MRI C and T spine with and without contrast ordered but with no specific etiology that would explain this  - cardiology consulted for assistance but doubt more can be offered at this time as pt already had cardiac cath  - provide analgesia as needed  - motrin started, seems like it helps but Cr is up so will need to monitor closely   Anemia, thrombocytopenia and leukocytosis - path smear worrisome for CLL - oncology consulted, plan for bone marrow biopsy  - pt with low grade fevers 99.5 F, HR stable but WBC worse in the past 24 hours, low threshold for initiating empiric ABX  - keep NPO after midnight   Acute kidney injury - unclear if related to motrin or in the setting of CLL - renal impairment due to leukemic infiltration even though uncommon, not entirely excluded especially given worsening leukocytosis, anemia and thrombocytopenia, will discuss with oncology  - BMP in AM  Hypothyroidism - Continue  levothyroxine  Hypertension, essential  - hold Lisinopril for now until renal function improves   Chronic pain - Continue hydrocodone  DVT prophylaxis: SCD's Code Status: Full  Family Communication: Patient and family at bedside  Disposition Plan: to be determined, needs bone marrow biopsy on Monday   Consultants:  Cardiology  Oncology   Procedures:   None  Antimicrobials:   None  Subjective: Still with mostly right upper chest pain and back pain.   Objective: Vitals:   01/24/17 2108 01/25/17 0444 01/25/17 0857 01/25/17 1242  BP: (!) 91/43 (!) 102/41 (!) 101/32 (!) 94/42  Pulse: 78 81 90 70  Resp:  18 20 20   Temp:  98.5 F (36.9 C) 98.7 F (37.1 C) 98.9 F (37.2 C)  TempSrc:  Oral Oral Oral  SpO2:  98% 100% 97%  Weight:  83 kg (183 lb)    Height:        Intake/Output Summary (Last 24 hours) at 01/25/17 1249 Last data filed at 01/25/17 1242  Gross per 24 hour  Intake              600 ml  Output             1201 ml  Net             -601 ml   Filed Weights   01/23/17 0605 01/24/17 0450 01/25/17 0444  Weight: 83.1 kg (183 lb 4.8 oz) 82.9 kg (182 lb 11.2 oz) 83 kg (183 lb)    Examination:  General exam: Appears calm  and comfortable  Respiratory system: Respiratory effort normal. Cardiovascular system: S1 & S2 heard, RRR. No JVD, rubs, gallops or clicks. No pedal edema. Gastrointestinal system: Abdomen is nondistended, soft and nontender. No organomegaly or masses felt.  Central nervous system: Alert and oriented. No focal neurological deficits. Extremities: Symmetric 5 x 5 power.  Data Reviewed: I have personally reviewed following labs and imaging studies  CBC:  Recent Labs Lab 01/22/17 0050 01/23/17 0530 01/24/17 0354 01/25/17 0435  WBC 27.3* 29.3* 33.6* 42.7*  NEUTROABS 1.1*  --   --   --   HGB 10.5* 9.3* 9.3* 8.6*  HCT 31.3* 28.3* 28.0* 26.4*  MCV 94.6 96.3 97.2 97.4  PLT 79* 68* 59* 54*   Basic Metabolic Panel:  Recent Labs Lab  01/22/17 0050 01/23/17 0530 01/24/17 0354 01/25/17 0435  NA 134* 136 136 135  K 4.0 4.0 4.2 4.0  CL 101 102 100* 100*  CO2 21* 26 28 27   GLUCOSE 183* 141* 149* 141*  BUN 11 12 14  26*  CREATININE 0.81 0.90 0.98 1.37*  CALCIUM 8.6* 8.2* 8.2* 8.0*   Liver Function Tests:  Recent Labs Lab 01/23/17 0530  AST 18  ALT 13*  ALKPHOS 51  BILITOT 0.7  PROT 5.8*  ALBUMIN 2.8*    Recent Labs Lab 01/22/17 0453  LIPASE 21   Cardiac Enzymes:  Recent Labs Lab 01/22/17 0535 01/22/17 1023 01/22/17 1558  TROPONINI 0.19* 0.20* 0.18*   Urine analysis:    Component Value Date/Time   COLORURINE YELLOW 01/22/2017 0309   APPEARANCEUR TURBID (A) 01/22/2017 0309   LABSPEC 1.030 01/22/2017 0309   PHURINE 5.5 01/22/2017 0309   GLUCOSEU NEGATIVE 01/22/2017 0309   HGBUR LARGE (A) 01/22/2017 0309   BILIRUBINUR NEGATIVE 01/22/2017 0309   KETONESUR NEGATIVE 01/22/2017 0309   PROTEINUR 100 (A) 01/22/2017 0309   NITRITE NEGATIVE 01/22/2017 0309   LEUKOCYTESUR NEGATIVE 01/22/2017 0309   Recent Results (from the past 240 hour(s))  Blood culture (routine x 2)     Status: None (Preliminary result)   Collection Time: 01/22/17  4:58 AM  Result Value Ref Range Status   Specimen Description BLOOD RIGHT HAND  Final   Special Requests BOTTLES DRAWN AEROBIC AND ANAEROBIC 5ML  Final   Culture NO GROWTH 2 DAYS  Final   Report Status PENDING  Incomplete  Blood culture (routine x 2)     Status: None (Preliminary result)   Collection Time: 01/22/17  5:00 AM  Result Value Ref Range Status   Specimen Description BLOOD LEFT ARM  Final   Special Requests BOTTLES DRAWN AEROBIC AND ANAEROBIC 5ML  Final   Culture NO GROWTH 2 DAYS  Final   Report Status PENDING  Incomplete    Radiology Studies: No results found. Scheduled Meds: . allopurinol  100 mg Oral BID  . colchicine  0.6 mg Oral BID  . ibuprofen  800 mg Oral TID  . levothyroxine  88 mcg Oral QAC breakfast  . senna-docusate  1 tablet Oral BID    Continuous Infusions:   LOS: 1 day   Time spent: 20 minutes   Faye Ramsay, MD [facility] Pager [phone]  If 7PM-7AM, please contact night-coverage [email or web address] Password [email or web address] 01/25/2017, 12:49 PM

## 2017-01-26 ENCOUNTER — Inpatient Hospital Stay (HOSPITAL_COMMUNITY): Payer: Medicare PPO

## 2017-01-26 DIAGNOSIS — B3323 Viral pericarditis: Secondary | ICD-10-CM

## 2017-01-26 LAB — FOLATE RBC
FOLATE, RBC: 1389 ng/mL (ref >498)
Folate, Hemolysate: 380.6 ng/mL
Hematocrit: 27.4 % — ABNORMAL LOW (ref 34.0–46.6)

## 2017-01-26 LAB — CBC WITH DIFFERENTIAL/PLATELET
BASOS ABS: 0 10*3/uL (ref 0.0–0.1)
Basophils Relative: 0 %
EOS ABS: 0 10*3/uL (ref 0.0–0.7)
Eosinophils Relative: 0 %
HCT: 25.7 % — ABNORMAL LOW (ref 36.0–46.0)
HEMOGLOBIN: 8.5 g/dL — AB (ref 12.0–15.0)
Lymphocytes Relative: 16 %
Lymphs Abs: 6.9 10*3/uL — ABNORMAL HIGH (ref 0.7–4.0)
MCH: 32.3 pg (ref 26.0–34.0)
MCHC: 33.1 g/dL (ref 30.0–36.0)
MCV: 97.7 fL (ref 78.0–100.0)
MONOS PCT: 82 %
Monocytes Absolute: 35.4 10*3/uL — ABNORMAL HIGH (ref 0.1–1.0)
Neutro Abs: 0.9 10*3/uL — ABNORMAL LOW (ref 1.7–7.7)
Neutrophils Relative %: 2 %
PLATELETS: 45 10*3/uL — AB (ref 150–400)
RBC: 2.63 MIL/uL — AB (ref 3.87–5.11)
RDW: 19.6 % — AB (ref 11.5–15.5)
WBC: 43.2 10*3/uL — AB (ref 4.0–10.5)

## 2017-01-26 LAB — BASIC METABOLIC PANEL
ANION GAP: 9 (ref 5–15)
BUN: 20 mg/dL (ref 6–20)
CALCIUM: 8.2 mg/dL — AB (ref 8.9–10.3)
CO2: 27 mmol/L (ref 22–32)
CREATININE: 1.07 mg/dL — AB (ref 0.44–1.00)
Chloride: 103 mmol/L (ref 101–111)
GFR calc Af Amer: 57 mL/min — ABNORMAL LOW (ref >60)
GFR, EST NON AFRICAN AMERICAN: 49 mL/min — AB (ref >60)
GLUCOSE: 126 mg/dL — AB (ref 65–99)
Potassium: 3.5 mmol/L (ref 3.5–5.1)
Sodium: 139 mmol/L (ref 135–145)

## 2017-01-26 LAB — BONE MARROW EXAM

## 2017-01-26 LAB — PROTIME-INR
INR: 1.24
PROTHROMBIN TIME: 15.7 s — AB (ref 11.4–15.2)

## 2017-01-26 LAB — LACTATE DEHYDROGENASE: LDH: 296 U/L — AB (ref 98–192)

## 2017-01-26 LAB — URIC ACID: URIC ACID, SERUM: 6.4 mg/dL (ref 2.3–6.6)

## 2017-01-26 MED ORDER — LIDOCAINE HCL 1 % IJ SOLN
INTRAMUSCULAR | Status: AC
  Filled 2017-01-26: qty 20

## 2017-01-26 MED ORDER — MIDAZOLAM HCL 2 MG/2ML IJ SOLN
INTRAMUSCULAR | Status: AC | PRN
  Administered 2017-01-26: 0.5 mg via INTRAVENOUS
  Administered 2017-01-26: 1 mg via INTRAVENOUS

## 2017-01-26 MED ORDER — FENTANYL CITRATE (PF) 100 MCG/2ML IJ SOLN
INTRAMUSCULAR | Status: AC
  Filled 2017-01-26: qty 2

## 2017-01-26 MED ORDER — MIDAZOLAM HCL 2 MG/2ML IJ SOLN
INTRAMUSCULAR | Status: AC
  Filled 2017-01-26: qty 2

## 2017-01-26 NOTE — Sedation Documentation (Signed)
Patient denies pain and is resting comfortably.  

## 2017-01-26 NOTE — Sedation Documentation (Signed)
Patient is resting comfortably. 

## 2017-01-26 NOTE — Progress Notes (Signed)
PROGRESS NOTE  Sharon Pineda  HDQ:[id number] DOB: [birth year] DOA: 01/22/2017  PCP: Lanier Clam, MD   HPI: Pt is 76 y.o. female with known CAD, type 2 mobitz with pacer, HTN, thryoid CA s/p thyroidectomy, OSA on CPAP, who presented with 1 week duration of intermittent mid area chest pain that is occasionally but not consistently radiating to her right shoulder, 7/10 in severity when present and associated with upper area back pain. No specific alleviating factors but occasionally worse with movement. Pt went to [facility] 4 days prior to this admission (PTA) for this, was admitted, underwent LHC that showed an affected diagonal artery only, had a echo that was normal, had carotids that were normal, and was discharged one day PTA without a clear diagnosis. Cardiology was consulted here at [facility] for assistance as troponins remains mildly elevated. Telemetry bed requested.  Assessment and Plan: Back pain and chest pain:  - unclear etiology at this time and possibly related to CLL  - MRI C and T spine with and without contrast ordered but with no specific etiology that would explain this  - cardiology consulted for assistance but doubt more can be offered at this time as pt already had cardiac cath  - provide analgesia as needed  - motrin started, seems like it helps but Cr is up so will need to monitor closely   Anemia, thrombocytopenia and leukocytosis - path smear worrisome for CLL - oncology consulted, plan for bone marrow biopsy  - pt with low grade fevers 99.5 F, HR stable but WBC worse in the past 24 hours, low threshold for initiating empiric ABX  - bone marrow biopsy done today, follow up on final report   Acute kidney injury - unclear if related to motrin or in the setting of CLL - Cr is trending down - BMP in AM  Hypothyroidism - Continue levothyroxine  Hypertension, essential  - hold Lisinopril for now until renal function improves   Chronic pain -  Continue hydrocodone  DVT prophylaxis: SCD's Code Status: Full  Family Communication: Patient and family at bedside  Disposition Plan: to be determined, pt still sick, WBC trending up, bone marrow biopsy done today   Consultants:  Cardiology  Oncology   Procedures:   None  Antimicrobials:   None  Subjective: Still with mostly right upper chest pain and back pain.   Objective: Vitals:   01/26/17 1110 01/26/17 1142 01/26/17 1145 01/26/17 1200  BP: (!) 111/59 (!) 105/41 (!) 89/66 (!) 106/48  Pulse: 84 99  79  Resp: 18 18    Temp:  98.6 F (37 C)    TempSrc:  Oral    SpO2: 100% 93%    Weight:      Height:        Intake/Output Summary (Last 24 hours) at 01/26/17 1250 Last data filed at 01/26/17 0852  Gross per 24 hour  Intake              360 ml  Output              500 ml  Net             -140 ml   Filed Weights   01/24/17 0450 01/25/17 0444 01/26/17 0711  Weight: 82.9 kg (182 lb 11.2 oz) 83 kg (183 lb) 81.7 kg (180 lb 1.6 oz)    Examination:  General exam: Appears calm and comfortable  Respiratory system: Respiratory effort normal. Cardiovascular system: S1 & S2 heard,  RRR. No JVD, rubs, gallops or clicks. No pedal edema. Gastrointestinal system: Abdomen is nondistended, soft and nontender. No organomegaly or masses felt.  Central nervous system: Alert and oriented. No focal neurological deficits. Extremities: Symmetric 5 x 5 power.  Data Reviewed: I have personally reviewed following labs and imaging studies  CBC:  Recent Labs Lab 01/22/17 0050 01/23/17 0530 01/24/17 0354 01/25/17 0435 01/26/17 0351  WBC 27.3* 29.3* 33.6* 42.7* 43.2*  NEUTROABS 1.1*  --   --   --  0.9*  HGB 10.5* 9.3* 9.3* 8.6* 8.5*  HCT 31.3* 28.3* 28.0* 26.4* 25.7*  MCV 94.6 96.3 97.2 97.4 97.7  PLT 79* 68* 59* 54* 45*   Basic Metabolic Panel:  Recent Labs Lab 01/22/17 0050 01/23/17 0530 01/24/17 0354 01/25/17 0435 01/26/17 0351  NA 134* 136 136 135 139  K 4.0 4.0  4.2 4.0 3.5  CL 101 102 100* 100* 103  CO2 21* 26 28 27 27   GLUCOSE 183* 141* 149* 141* 126*  BUN 11 12 14  26* 20  CREATININE 0.81 0.90 0.98 1.37* 1.07*  CALCIUM 8.6* 8.2* 8.2* 8.0* 8.2*   Liver Function Tests:  Recent Labs Lab 01/23/17 0530  AST 18  ALT 13*  ALKPHOS 51  BILITOT 0.7  PROT 5.8*  ALBUMIN 2.8*    Recent Labs Lab 01/22/17 0453  LIPASE 21   Cardiac Enzymes:  Recent Labs Lab 01/22/17 0535 01/22/17 1023 01/22/17 1558  TROPONINI 0.19* 0.20* 0.18*   Urine analysis:    Component Value Date/Time   COLORURINE YELLOW 01/22/2017 0309   APPEARANCEUR TURBID (A) 01/22/2017 0309   LABSPEC 1.030 01/22/2017 0309   PHURINE 5.5 01/22/2017 0309   GLUCOSEU NEGATIVE 01/22/2017 0309   HGBUR LARGE (A) 01/22/2017 0309   BILIRUBINUR NEGATIVE 01/22/2017 0309   KETONESUR NEGATIVE 01/22/2017 0309   PROTEINUR 100 (A) 01/22/2017 0309   NITRITE NEGATIVE 01/22/2017 0309   LEUKOCYTESUR NEGATIVE 01/22/2017 0309   Recent Results (from the past 240 hour(s))  Blood culture (routine x 2)     Status: None (Preliminary result)   Collection Time: 01/22/17  4:58 AM  Result Value Ref Range Status   Specimen Description BLOOD RIGHT HAND  Final   Special Requests BOTTLES DRAWN AEROBIC AND ANAEROBIC 5ML  Final   Culture NO GROWTH 3 DAYS  Final   Report Status PENDING  Incomplete  Blood culture (routine x 2)     Status: None (Preliminary result)   Collection Time: 01/22/17  5:00 AM  Result Value Ref Range Status   Specimen Description BLOOD LEFT ARM  Final   Special Requests BOTTLES DRAWN AEROBIC AND ANAEROBIC 5ML  Final   Culture NO GROWTH 3 DAYS  Final   Report Status PENDING  Incomplete    Radiology Studies: Ct Bone Marrow Biopsy & Aspiration  Result Date: 01/26/2017 INDICATION: 76 year old female with leukocytosis EXAM: CT BONE MARROW BIOPSY AND ASPIRATION MEDICATIONS: None. ANESTHESIA/SEDATION: Moderate (conscious) sedation was employed during this procedure. A total of Versed  1.5 mg and Fentanyl 0 mcg was administered intravenously. Moderate Sedation Time: 0 minutes. FLUOROSCOPY TIME:  CT COMPLICATIONS: None PROCEDURE: The procedure risks, benefits, and alternatives were explained to the patient. Questions regarding the procedure were encouraged and answered. The patient understands and consents to the procedure. Scout CT of the pelvis was performed for surgical planning purposes. The posterior pelvis was prepped with Betadinein a sterile fashion, and a sterile drape was applied covering the operative field. A sterile gown and sterile gloves were used for  the procedure. Local anesthesia was provided with 1% Lidocaine. We targeted the right posterior iliac bone for biopsy. The skin and subcutaneous tissues were infiltrated with 1% lidocaine without epinephrine. A small stab incision was made with an 11 blade scalpel, and an 11 gauge Murphy needle was advanced with CT guidance to the posterior cortex. Manual forced was used to advance the needle through the posterior cortex and the stylet was removed. A bone marrow aspirate was retrieved and passed to a cytotechnologist in the room. The Murphy needle was then advanced without the stylet for a core biopsy. The core biopsy was retrieved and also passed to a cytotechnologist. Manual pressure was used for hemostasis and a sterile dressing was placed. No complications were encountered no significant blood loss was encountered. Patient tolerated the procedure well and remained hemodynamically stable throughout. IMPRESSION: Status post CT-guided bone marrow biopsy, with tissue specimen sent to pathology for complete histopathologic analysis Signed, Dulcy Fanny. Earleen Newport, DO [facility] Rad[facility] Electronically Signed   By: Corrie Mckusick D.O.   On: 01/26/2017 12:17   Scheduled Meds: . allopurinol  100 mg Oral BID  . colchicine  0.6 mg Oral BID  . ibuprofen  800 mg Oral TID  . levothyroxine  88 mcg  Oral QAC breakfast  . lidocaine      . midazolam      . senna-docusate  1 tablet Oral BID   Continuous Infusions:   LOS: 2 days   Time spent: 20 minutes   Faye Ramsay, MD [facility] Pager [phone]  If 7PM-7AM, please contact night-coverage [email or web address] Password [email or web address] 01/26/2017, 12:50 PM

## 2017-01-26 NOTE — Progress Notes (Signed)
Progress Note  Patient Name: Sharon Pineda Date of Encounter: 01/26/2017  Primary Cardiologist: New (Dr. 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   Subjective   She is scheduled for a bone marrow biopsy today.  Reports having chest and back pain with movement. States she gets SOB when ambulating to the bathroom.    Inpatient Medications    Scheduled Meds: . allopurinol  100 mg Oral BID  . colchicine  0.6 mg Oral BID  . ibuprofen  800 mg Oral TID  . levothyroxine  88 mcg Oral QAC breakfast  . senna-docusate  1 tablet Oral BID   Continuous Infusions:  PRN Meds: HYDROcodone-acetaminophen, morphine injection, ondansetron **OR** ondansetron (ZOFRAN) IV   Vital Signs    Vitals:   01/25/17 0857 01/25/17 1242 01/25/17 1942 01/26/17 0711  BP: (!) 101/32 (!) 94/42 (!) 94/43 (!) 108/52  Pulse: 90 70 87 83  Resp: 20 20 18 18   Temp: 98.7 F (37.1 C) 98.9 F (37.2 C) 98.9 F (37.2 C) 98.7 F (37.1 C)  TempSrc: Oral Oral Oral Oral  SpO2: 100% 97% 97% 99%  Weight:    81.7 kg (180 lb 1.6 oz)  Height:        Intake/Output Summary (Last 24 hours) at 01/26/17 0855 Last data filed at 01/26/17 1275  Gross per 24 hour  Intake              480 ml  Output              901 ml  Net             -421 ml   Filed Weights   01/24/17 0450 01/25/17 0444 01/26/17 0711  Weight: 82.9 kg (182 lb 11.2 oz) 83 kg (183 lb) 81.7 kg (180 lb 1.6 oz)    Telemetry    Paced - Personally Reviewed  ECG    NA - Personally Reviewed  Physical Exam   GEN: No acute distress.   Neck: No JVD Cardiac: RRR, no murmurs, rubs, or gallops.  Respiratory: Clear to auscultation  bilaterally. GI: Soft, nontender, non-distended  MS: No edema; No deformity. Neuro:  Nonfocal  Psych: Normal affect   Labs    Chemistry  Recent Labs Lab 01/23/17 0530 01/24/17 0354 01/25/17 0435 01/26/17 0351  NA 136 136 135 139  K 4.0 4.2 4.0 3.5  CL 102 100* 100* 103  CO2 26 28 27 27   GLUCOSE 141* 149* 141* 126*  BUN 12 14 26* 20  CREATININE 0.90 0.98 1.37* 1.07*  CALCIUM 8.2* 8.2* 8.0* 8.2*  PROT 5.8*  --   --   --   ALBUMIN 2.8*  --   --   --   AST 18  --   --   --   ALT 13*  --   --   --   ALKPHOS 51  --   --   --   BILITOT 0.7  --   --   --   GFRNONAA >60 55* 37* 49*  GFRAA >60 >60 43* 57*  ANIONGAP 8 8 8 9      Hematology  Recent Labs Lab 01/24/17 0354 01/25/17 0435 01/26/17 0351  WBC 33.6* 42.7* 43.2*  RBC 2.88*  2.88* 2.71* 2.63*  HGB 9.3* 8.6* 8.5*  HCT 28.0* 26.4* 25.7*  MCV 97.2 97.4 97.7  MCH 32.3 31.7 32.3  MCHC 33.2 32.6 33.1  RDW 19.1* 19.5* 19.6*  PLT 59* 54* 45*    Cardiac Enzymes  Recent Labs Lab 01/22/17 0535 01/22/17 1023 01/22/17 1558  TROPONINI 0.19* 0.20* 0.18*     Recent Labs Lab 01/22/17 0101 01/22/17 0509  TROPIPOC 0.06 0.11*     BNPNo results for input(s): BNP, PROBNP in the last 168 hours.   DDimer No results for input(s): DDIMER in the last 168 hours.   Radiology    No results found.  Cardiac Studies   ECHO:  - Left ventricle: The cavity size was normal. There was moderate   concentric hypertrophy. Systolic function was normal. The   estimated ejection fraction was in the range of 55% to 60%.   Cannot exclude hypokinesis of the apicalinferior myocardium.   Features are consistent with a pseudonormal left ventricular   filling pattern, with concomitant abnormal relaxation and   increased filling pressure (grade 2 diastolic dysfunction).   Doppler parameters are consistent with high ventricular filling   pressure. - Aortic valve: Moderately calcified annulus. Trileaflet; normal   thickness,  moderately calcified leaflets. - Mitral valve: Calcified annulus. There was mild regurgitation. - Left atrium: The atrium was mildly dilated. - Pericardium, extracardiac: A small, free-flowing pericardial   effusion was identified along the right ventricular free wall and   along the right atrial free wall. The fluid had no internal   echoes. There was mildright atrial chamber collapse for less than   50% of the cardiac cycle. There was evidence for mildly increased   RV-LV interaction demonstrated by respirophasic changes in   transmitral velocities.   Assessment & Plan    CHEST PAIN:  Etiology is not clear. Troponin elevation was non diagnostic. Her echo showed a small effusion and patient was started on started on Motrin and colchicine for possible pericarditis given continued CP. Scr improved from 1.3 yesterday to 1.0 today.  -Continue Motrin 800 mg TID -Continue Colchicine 0.6 mg BID -Continue to hold her ACE inhibitor for a few days since her BP is soft and she is on high dose NSAID.  LEUKOCYTOSIS:  Oncology is evaluating and she is to have a bone marrow biopsy today.     Signed, Shela Leff, MD  01/26/2017, 8:55 AM    I have personally seen and examined this patient with Dr. Marlowe Sax. I agree with the assessment and plan as outlined above. Will continue treatment for possible pericarditis. No plans for invasive cardiac evaluation. She will follow up post discharge with Dr. Ellyn Hack.   Lauree Chandler 01/26/2017 10:53 AM'

## 2017-01-26 NOTE — Progress Notes (Signed)
Report given to on-coming nurse.  Band-aid from bone marrow biopsy has small spot on it but is not actively bleeding.

## 2017-01-26 NOTE — Procedures (Signed)
Interventional Radiology Procedure Note  Procedure: CT guided aspirate and core biopsy of right posterior iliac bone Complications: None Recommendations: - Bedrest supine x 3 hrs - OTC's PRN  Pain - Follow biopsy results  Signed,  Dulcy Fanny. Earleen Newport, DO

## 2017-01-26 NOTE — Progress Notes (Signed)
Pt refused bed alarm on. Will continue to do hourly rounding. 

## 2017-01-26 NOTE — Progress Notes (Signed)
PT Cancellation Note  Patient Details Name: Sharon Pineda MRN: [id number] DOB: 1941-12-18   Cancelled Treatment:    Reason Eval/Treat Not Completed: Patient at procedure or test/unavailable   Duncan Dull 01/26/2017, 11:09 AM

## 2017-01-26 NOTE — Sedation Documentation (Signed)
Pt is radiology RN station. Family at bedside. No complaints of pain or discomfort at this time.

## 2017-01-26 NOTE — Progress Notes (Signed)
OT Cancellation Note  Patient Details Name: Sharon Pineda MRN: [id number] DOB: 15-Aug-1941   Cancelled Treatment:    Reason Eval/Treat Not Completed: Medical issues which prohibited therapy (Pt with bed rest orders following biopsy. Will follow.)  [name] 01/26/2017, 1:44 PM  [phone]

## 2017-01-27 DIAGNOSIS — C9200 Acute myeloblastic leukemia, not having achieved remission: Principal | ICD-10-CM

## 2017-01-27 DIAGNOSIS — I319 Disease of pericardium, unspecified: Secondary | ICD-10-CM

## 2017-01-27 LAB — CBC
HCT: 24.9 % — ABNORMAL LOW (ref 36.0–46.0)
HEMOGLOBIN: 8.2 g/dL — AB (ref 12.0–15.0)
MCH: 32 pg (ref 26.0–34.0)
MCHC: 32.9 g/dL (ref 30.0–36.0)
MCV: 97.3 fL (ref 78.0–100.0)
Platelets: 44 10*3/uL — ABNORMAL LOW (ref 150–400)
RBC: 2.56 MIL/uL — AB (ref 3.87–5.11)
RDW: 19.4 % — ABNORMAL HIGH (ref 11.5–15.5)
WBC: 39.5 10*3/uL — ABNORMAL HIGH (ref 4.0–10.5)

## 2017-01-27 LAB — BASIC METABOLIC PANEL
Anion gap: 13 (ref 5–15)
BUN: 23 mg/dL — AB (ref 6–20)
CHLORIDE: 104 mmol/L (ref 101–111)
CO2: 24 mmol/L (ref 22–32)
CREATININE: 1.1 mg/dL — AB (ref 0.44–1.00)
Calcium: 8.6 mg/dL — ABNORMAL LOW (ref 8.9–10.3)
GFR calc Af Amer: 55 mL/min — ABNORMAL LOW (ref >60)
GFR calc non Af Amer: 48 mL/min — ABNORMAL LOW (ref >60)
GLUCOSE: 127 mg/dL — AB (ref 65–99)
POTASSIUM: 3.3 mmol/L — AB (ref 3.5–5.1)
Sodium: 141 mmol/L (ref 135–145)

## 2017-01-27 LAB — CULTURE, BLOOD (ROUTINE X 2)
CULTURE: NO GROWTH
CULTURE: NO GROWTH

## 2017-01-27 LAB — PATHOLOGIST SMEAR REVIEW

## 2017-01-27 MED ORDER — TRAMADOL HCL 50 MG PO TABS: 50.0000 mg | ORAL_TABLET | Freq: Four times a day (QID) | ORAL | 0 refills | Status: AC | PRN

## 2017-01-27 MED ORDER — COLCHICINE 0.6 MG PO TABS: 0.6000 mg | ORAL_TABLET | Freq: Every day | ORAL | 0 refills | Status: AC

## 2017-01-27 NOTE — Progress Notes (Signed)
Sharon Pineda   DOB:10-25-1941   OV#:[id number]    Subjective: The patient is seen. For other family members are present to discuss test results. In the meantime, she has persistent chest pain. The patient denies any recent signs or symptoms of bleeding such as spontaneous epistaxis, hematuria or hematochezia.   Objective:  Vitals:   01/27/17 0717 01/27/17 1223  BP: (!) 92/42 (!) 107/49  Pulse: 76 79  Resp:  18  Temp: 99.5 F (37.5 C) 98.4 F (36.9 C)     Intake/Output Summary (Last 24 hours) at 01/27/17 1848 Last data filed at 01/27/17 1402  Gross per 24 hour  Intake              480 ml  Output              100 ml  Net              380 ml    GENERAL:alert, no distress and comfortable SKIN: skin color, texture, turgor are normal, no rashes or significant lesions EYES: normal, Conjunctiva are pink and non-injected, sclera clear Musculoskeletal:no cyanosis of digits and no clubbing  NEURO: alert & oriented x 3 with fluent speech, no focal motor/sensory deficits   Labs:  Lab Results  Component Value Date   WBC 39.5 (H) 01/27/2017   HGB 8.2 (L) 01/27/2017   HCT 24.9 (L) 01/27/2017   MCV 97.3 01/27/2017   PLT 44 (L) 01/27/2017   NEUTROABS 0.9 (L) 01/26/2017    Lab Results  Component Value Date   NA 141 01/27/2017   K 3.3 (L) 01/27/2017   CL 104 01/27/2017   CO2 24 01/27/2017    Studies:  Ct Bone Marrow Biopsy & Aspiration  Result Date: 01/26/2017 INDICATION: 76 year old female with leukocytosis EXAM: CT BONE MARROW BIOPSY AND ASPIRATION MEDICATIONS: None. ANESTHESIA/SEDATION: Moderate (conscious) sedation was employed during this procedure. A total of Versed 1.5 mg and Fentanyl 0 mcg was administered intravenously. Moderate Sedation Time: 0 minutes. FLUOROSCOPY TIME:  CT COMPLICATIONS: None PROCEDURE: The procedure risks, benefits, and alternatives were explained to the patient. Questions regarding the procedure were encouraged and answered. The patient understands and  consents to the procedure. Scout CT of the pelvis was performed for surgical planning purposes. The posterior pelvis was prepped with Betadinein a sterile fashion, and a sterile drape was applied covering the operative field. A sterile gown and sterile gloves were used for the procedure. Local anesthesia was provided with 1% Lidocaine. We targeted the right posterior iliac bone for biopsy. The skin and subcutaneous tissues were infiltrated with 1% lidocaine without epinephrine. A small stab incision was made with an 11 blade scalpel, and an 11 gauge Murphy needle was advanced with CT guidance to the posterior cortex. Manual forced was used to advance the needle through the posterior cortex and the stylet was removed. A bone marrow aspirate was retrieved and passed to a cytotechnologist in the room. The Murphy needle was then advanced without the stylet for a core biopsy. The core biopsy was retrieved and also passed to a cytotechnologist. Manual pressure was used for hemostasis and a sterile dressing was placed. No complications were encountered no significant blood loss was encountered. Patient tolerated the procedure well and remained hemodynamically stable throughout. IMPRESSION: Status post CT-guided bone marrow biopsy, with tissue specimen sent to pathology for complete histopathologic analysis Signed, Dulcy Fanny. Earleen Newport, DO [facility] Radiology Electronically Signed   By: Corrie Mckusick D.O.   On:  01/26/2017 12:17   Bone Marrow Flow Cytometry - ACUTE MYELOID LEUKEMIA WITH MONOCYTIC DIFFERENTIATION. JULIA MANNY MDGROSS AND MICROSCOPIC INFORMATION Source Bone Marrow Flow Cytometry Microscopic Assessment & Plan:   Pancytopenia Newly diagnosed acute myelogenous leukemia  I have a long discussion with the patient and family members. We discussed the approach for acute myelogenous leukemia. Palliative chemotherapy would be difficult in this situation given  her significant heart disease. The patient has made informed decision not to pursue treatment. We also discussed potential transfusion support and the patient declined this as well. She would like to go home with palliative care/hospice in place Due to the patient living in the state of [place], I recommend she contact her primary care doctor to have this set up  Persistent chest pain This is medically managed. I recommend liquid morphine sulfate for pain control.  Discharge planning The patient desired to go home I have spoken with the hospitalist who would facilitate discharge I will sign off.    Heath Lark, MD 01/27/2017  6:48 PM

## 2017-01-27 NOTE — Discharge Instructions (Signed)
Acute Lymphoblastic Leukemia, Adult  Acute lymphoblastic leukemia (ALL) is a rapid-growth cancer of the blood and the soft tissue inside your bones (bone marrow). Normally, bone marrow makes blast cells that develop into important immune cells called lymphocytes or other mature blood cells (red, platelet, or white). These mature cells help to fight infection, carry oxygen, and stop bleeding. With ALL, the bone marrow makes abnormal, or unformed blast cells. These abnormal cells develop into leukemia cells and occupy space in the blood where healthy cells need room. The rapidly growing leukemia cells begin to take over. Leukemia cells do not fight infection or carry out other important jobs in the blood, and symptoms of infection and illness appear.  There are several types of ALL. They vary depending on the size and number of leukemia cells and which healthy cells are most affected. Adult ALL is extremely rare. Most cases occur in children, but ALL can also occur in adults.  CAUSES   Experts are not clear on what causes ALL in many cases. With ALL, the DNA in the bone marrow is damaged causing it to form abnormal cells. For the most part, it does not appear to be genetic, but related to other external factors.   RISK FACTORS   Female.   Caucasian.   Age older than 70 years.   History of chemotherapy or radiation therapy.   Exposure to radiation from atomic bomb.   Certain genetic disorders, such as Down syndrome.  SIGNS AND SYMPTOMS    Tiring easily.   Weakness.   Fever.   Night sweats.   Repeat infections.   Flat spots under the skin.   Shortness of breath.   Weight loss.   Loss of appetite.   Abdominal pain.   Bone pain or aches.   Joint pain or aches.   Pale skin.   Painless lumps in the neck, underarm, stomach, or groin.   Nosebleeds and easy bleeding from minor cuts.   Swollen glands.  DIAGNOSIS   The diagnosis of ALL is made by tests such as:   Blood tests to check blood cell counts and  the shape of the blood cells (morphology).   Bone marrow sampling test.   Genetic testing.   X-ray exams, ultrasonography, or CT imaging tests.   Spinal fluid tests to check for leukemia cells.  TREATMENT   The type of ALL diagnosed will guide treatment options. Treatment can last up to 2-3 years and aims to destroy leukemia cells as well as stop new diseased cells from growing. Treatment may include:   Chemotherapy.   Radiation therapy to kill cancer cells.   Targeted medicines to treat specific chromosomal mutations.   Stem cell transplant to replace diseased bone marrow with healthy donor bone marrow.   Experimental treatments through clinical trials.   In rare cases, surgery.  HOME CARE INSTRUCTIONS   When you are on chemotherapy:   You and any visitors should wash hands often, especially before meals, after being outside, and after using the toilet.   Keep your teeth and gums clean and well cared for. Use soft toothbrushes.   When visiting a health care facility, ask about side entrances or waiting areas where you will not be exposed to infections.   Take medicines only as directed by your health care provider.   Use a good sunblock and clothing to prevent sun exposure.   Usually, it is recommended that other family members receive an influenza shot every year.  SEEK MEDICAL   CARE IF:   You have a cough or cold symptoms.   You have a sore throat.   You have painful urination.   You have frequent diarrhea.   You have frequent vomiting.   You have a skin rash.   You have a fever.   You have chills.   Your have been exposed to chickenpox or measles, especially if you have not been immunized or are not immune to these illnesses.  SEEK IMMEDIATE MEDICAL CARE IF:   You have trouble breathing.   You have blood in urine or stools.     This information is not intended to replace advice given to you by your health care provider. Make sure you discuss any questions you have with your health care  provider.     Document Released: 09/28/2013 Document Revised: 12/29/2014 Document Reviewed: 09/28/2013  Elsevier Interactive Patient Education 2017 [employer]

## 2017-01-27 NOTE — Progress Notes (Signed)
I spent total of 25 minutes on counseling

## 2017-01-27 NOTE — Progress Notes (Signed)
Progress Note  Patient Name: Sharon Pineda Date of Encounter: 01/27/2017  Primary Cardiologist: New (Dr. 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   Subjective   Reports having chest and back pain with movement. States she gets heart palpiations when ambulating. Denies having any SOB.    Inpatient Medications    Scheduled Meds: . allopurinol  100 mg Oral BID  . colchicine  0.6 mg Oral BID  . ibuprofen  800 mg Oral TID  . levothyroxine  88 mcg Oral QAC breakfast  . senna-docusate  1 tablet Oral BID   Continuous Infusions:  PRN Meds: HYDROcodone-acetaminophen, morphine injection, ondansetron **OR** ondansetron (ZOFRAN) IV   Vital Signs    Vitals:   01/26/17 1503 01/26/17 2025 01/27/17 0500 01/27/17 0717  BP: (!) 115/41 (!) 93/36 (!) 105/50 (!) 92/42  Pulse: 80 81 74 76  Resp:  18 18   Temp:  98.6 F (37 C) 98.4 F (36.9 C) 99.5 F (37.5 C)  TempSrc:  Oral Oral Oral  SpO2:  97% 99% 98%  Weight:   82.5 kg (181 lb 12.8 oz)   Height:        Intake/Output Summary (Last 24 hours) at 01/27/17 0841 Last data filed at 01/27/17 2694  Gross per 24 hour  Intake              480 ml  Output                0 ml  Net              480 ml   Filed Weights   01/25/17 0444 01/26/17 0711 01/27/17 0500  Weight: 83 kg (183 lb) 81.7 kg (180 lb 1.6 oz) 82.5 kg (181 lb 12.8 oz)    Telemetry    Paced - Personally Reviewed  ECG    No EKG today.   Physical Exam   GEN: No acute distress.   Neck: No JVD Cardiac: RRR, no murmurs, rubs, or gallops.  Respiratory: Clear to auscultation bilaterally. GI: Soft, nontender, non-distended  MS: No edema; No  deformity. Neuro:  Nonfocal  Psych: Normal affect   Labs    Chemistry  Recent Labs Lab 01/23/17 0530  01/25/17 0435 01/26/17 0351 01/27/17 0516  NA 136  < > 135 139 141  K 4.0  < > 4.0 3.5 3.3*  CL 102  < > 100* 103 104  CO2 26  < > 27 27 24   GLUCOSE 141*  < > 141* 126* 127*  BUN 12  < > 26* 20 23*  CREATININE 0.90  < > 1.37* 1.07* 1.10*  CALCIUM 8.2*  < > 8.0* 8.2* 8.6*  PROT 5.8*  --   --   --   --   ALBUMIN 2.8*  --   --   --   --   AST 18  --   --   --   --   ALT 13*  --   --   --   --   ALKPHOS 51  --   --   --   --   BILITOT 0.7  --   --   --   --  GFRNONAA >60  < > 37* 49* 48*  GFRAA >60  < > 43* 57* 55*  ANIONGAP 8  < > 8 9 13   < > = values in this interval not displayed.   Hematology  Recent Labs Lab 01/25/17 0435 01/26/17 0351 01/27/17 0516  WBC 42.7* 43.2* 39.5*  RBC 2.71* 2.63* 2.56*  HGB 8.6* 8.5* 8.2*  HCT 26.4* 25.7* 24.9*  MCV 97.4 97.7 97.3  MCH 31.7 32.3 32.0  MCHC 32.6 33.1 32.9  RDW 19.5* 19.6* 19.4*  PLT 54* 45* 44*    Cardiac Enzymes  Recent Labs Lab 01/22/17 0535 01/22/17 1023 01/22/17 1558  TROPONINI 0.19* 0.20* 0.18*     Recent Labs Lab 01/22/17 0101 01/22/17 0509  TROPIPOC 0.06 0.11*     BNPNo results for input(s): BNP, PROBNP in the last 168 hours.   DDimer No results for input(s): DDIMER in the last 168 hours.   Radiology    Ct Bone Marrow Biopsy & Aspiration  Result Date: 01/26/2017 INDICATION: 76 year old female with leukocytosis EXAM: CT BONE MARROW BIOPSY AND ASPIRATION MEDICATIONS: None. ANESTHESIA/SEDATION: Moderate (conscious) sedation was employed during this procedure. A total of Versed 1.5 mg and Fentanyl 0 mcg was administered intravenously. Moderate Sedation Time: 0 minutes. FLUOROSCOPY TIME:  CT COMPLICATIONS: None PROCEDURE: The procedure risks, benefits, and alternatives were explained to the patient. Questions regarding the procedure were encouraged and answered. The patient understands and  consents to the procedure. Scout CT of the pelvis was performed for surgical planning purposes. The posterior pelvis was prepped with Betadinein a sterile fashion, and a sterile drape was applied covering the operative field. A sterile gown and sterile gloves were used for the procedure. Local anesthesia was provided with 1% Lidocaine. We targeted the right posterior iliac bone for biopsy. The skin and subcutaneous tissues were infiltrated with 1% lidocaine without epinephrine. A small stab incision was made with an 11 blade scalpel, and an 11 gauge Murphy needle was advanced with CT guidance to the posterior cortex. Manual forced was used to advance the needle through the posterior cortex and the stylet was removed. A bone marrow aspirate was retrieved and passed to a cytotechnologist in the room. The Murphy needle was then advanced without the stylet for a core biopsy. The core biopsy was retrieved and also passed to a cytotechnologist. Manual pressure was used for hemostasis and a sterile dressing was placed. No complications were encountered no significant blood loss was encountered. Patient tolerated the procedure well and remained hemodynamically stable throughout. IMPRESSION: Status post CT-guided bone marrow biopsy, with tissue specimen sent to pathology for complete histopathologic analysis Signed, Dulcy Fanny. Earleen Newport, DO [facility] Radiology Electronically Signed   By: Corrie Mckusick D.O.   On: 01/26/2017 12:17    Cardiac Studies   ECHO:  - Left ventricle: The cavity size was normal. There was moderate   concentric hypertrophy. Systolic function was normal. The   estimated ejection fraction was in the range of 55% to 60%.   Cannot exclude hypokinesis of the apicalinferior myocardium.   Features are consistent with a pseudonormal left ventricular   filling pattern, with concomitant abnormal relaxation and   increased filling pressure (grade 2  diastolic dysfunction).   Doppler parameters are consistent with high ventricular filling   pressure. - Aortic valve: Moderately calcified annulus. Trileaflet; normal   thickness, moderately calcified leaflets. - Mitral valve: Calcified annulus. There was mild regurgitation. - Left atrium: The atrium was mildly dilated. -  Pericardium, extracardiac: A small, free-flowing pericardial   effusion was identified along the right ventricular free wall and   along the right atrial free wall. The fluid had no internal   echoes. There was mildright atrial chamber collapse for less than   50% of the cardiac cycle. There was evidence for mildly increased   RV-LV interaction demonstrated by respirophasic changes in   transmitral velocities.   Assessment & Plan    CHEST PAIN:  Etiology is not clear. Troponin elevation was non diagnostic. Her echo showed a small effusion and patient was started on started on Motrin and colchicine for possible pericarditis given continued CP. SCr improved from 1.3 to 1.1. No plans for invasive cardiac evaluation.  -Continue Motrin 800 mg TID -Continue Colchicine 0.6 mg BID -Continue to hold her ACE inhibitor for a few days since her BP is soft and she is on high dose NSAID. -Will need to f/u with Dr. Ellyn Hack post-discharge   LEUKOCYTOSIS:  Oncology is evaluating. Bone marrow biopsy results pending.     Signed, Shela Leff, MD  01/27/2017, 8:41 AM    I have personally seen and examined this patient with Dr. Marlowe Sax. I agree with the assessment and plan as outlined above. She has pericarditis, small effusion. She is on appropriate therapy for this. NO changes. She will need the current anti-inflammatory therapy for at least three weeks. We will sign off for now. Please call with questions. Please alert [place] at time of discharge and we can arrange f/u with DR. Ellyn Hack.   Lauree Chandler 01/27/2017 11:19 AM

## 2017-01-27 NOTE — Discharge Summary (Signed)
Physician Discharge Summary  Sharon Pineda GYJ:[id number] DOB: 14-Dec-1941 DOA: 01/22/2017  PCP: Lanier Clam, MD  Admit date: 01/22/2017 Discharge date: 01/27/2017  Recommendations for Outpatient Follow-up:  1. Pt will need to follow up with PCP as needed 2. Pt prefers for conservative management and hospice to follow upon discharge, will ask PCP to refer per pt's request  3. Lisinopril stopped due to low BP  Discharge Diagnoses:  Principal Problem:   Back pain Active Problems:   Hypothyroidism   HTN (hypertension)   Pain in the chest   Second degree Mobitz II AV block   Normocytic anemia   Thrombocytopenia (HCC)   Leukocytosis   Pancytopenia, acquired ([place])   Pericarditis  Discharge Conditio: Stable  Diet recommendation: Heart healthy diet discussed in details   Brief HPI:  Pt is 76 y.o.femalewith known CAD, type 2 mobitz with pacer, HTN, thryoid CA s/p thyroidectomy, OSA on CPAP, who presented with 1 week duration of intermittent mid area chest pain that is occasionally but not consistently radiating to her right shoulder, 7/10 in severity when present and associated with upper area back pain. No specific alleviating factors but occasionally worse with movement. Pt went to [facility] 4 days prior to this admission (PTA) for this, was admitted, underwent LHC that showed an affected diagonal artery only, had a echo that was normal, had carotids that were normal, and was discharged one day PTA without a clear diagnosis. Cardiology was consulted here at [facility] for assistance as troponins remains mildly elevated. Telemetry bed requested.  Assessment and Plan: Back pain and chest pain: - MRI C and T spine with and without contrast ordered but with no specific etiology that would explain this  - cardiology consulted for assistance but doubt more can be offered at this time as pt already had cardiac cath  - provide analgesia as needed  - motrin started, seems like it is  helping but per oncology would avoid motrin with low platelets - will add tramadol   Anemia, thrombocytopenia and leukocytosis - path smear worrisome for CLL - pt underwent bone marrow biopsy 2/5, d/w Dr. 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 Alvy Bimler discussed with pt and family, pt prefers no further interventions, wants to focus on comfort - will ask PCP to refer to hospice   Acute kidney injury - unclear etiology, encouraged PO intake  - Cr is trending down  Hypothyroidism - Continue levothyroxine  Hypokalemia - supplemented prior to discharge   Hypertension, essential  - hold Lisinopril due to low BP   Chronic pain - Continue hydrocodone  DVT prophylaxis: SCD's Code Status: Full  Family Communication: Patient and family at bedside  Disposition Plan: home   Consultants:  Cardiology  Oncology   Procedures/Studies: Dg Chest 2 View  Result Date: 01/22/2017 CLINICAL DATA:  Acute onset of mid chest pain and pain between the scapula. Initial encounter. EXAM: CHEST  2 VIEW COMPARISON:  Chest radiograph from 01/29/2016 FINDINGS: The lungs are well-aerated. Minimal left basilar atelectasis is noted. There is no evidence of focal opacification, pleural effusion or pneumothorax. The heart is borderline normal in size. A pacemaker is noted at the left chest wall, with leads ending at the right atrium and right ventricle. No acute osseous abnormalities are seen. Clips are noted within the right upper quadrant, reflecting prior cholecystectomy. A bone island is seen at the proximal right humerus. IMPRESSION: Minimal left basilar atelectasis noted.  Lungs otherwise clear. Electronically Signed  By: Garald Balding M.D.   On: 01/22/2017 01:19   Mr Cervical Spine W Wo Contrast  Result Date: 01/22/2017 CLINICAL DATA:  76 y/o F; chronic back and neck pain with 1 week of right arm numbness and tingling also occasionally in the left arm. EXAM: MRI CERVICAL  SPINE WITHOUT AND WITH CONTRAST MRI THORACIC SPINE WITHOUT AND WITH CONTRAST TECHNIQUE: Multiplanar and multiecho pulse sequences of the cervical spine and thoracic spine, to include the craniocervical junction thoracolumbar, and cervicothoracic junction, were obtained without and with intravenous contrast. CONTRAST:  23m MULTIHANCE GADOBENATE DIMEGLUMINE 529 MG/ML IV SOLN COMPARISON:  None. FINDINGS: Cervical spine MRI findings: Alignment: Straightening of cervical lordosis with slight reversal at the C5-6 level. No listhesis. Vertebrae: No fracture, evidence of discitis, or bone lesion. No abnormal enhancement. Cord: No abnormal cord signal appear no abnormal enhancement. Posterior Fossa, vertebral arteries, paraspinal tissues: Negative. Disc levels: C2-3: No significant disc displacement, foraminal narrowing, or canal stenosis. C3-4: No significant disc displacement, foraminal narrowing, or canal stenosis. C4-5: No significant disc displacement, foraminal narrowing, or canal stenosis. Mild facet hypertrophy. C5-6: Small disc osteophyte complex with bilateral uncovertebral and facet hypertrophy. Mild canal stenosis and left-sided foraminal narrowing. C6-7: Small disc osteophyte complex and bilateral uncovertebral/ facet hypertrophy. Mild bilateral foraminal narrowing and canal stenosis. C7-T1: No significant disc displacement, foraminal narrowing, or canal stenosis. Thoracic spine MRI findings: Alignment: Physiologic. Vertebrae: No fracture, evidence of discitis, or bone lesion. No abnormal enhancement. Cord: Normal signal and morphology.  No abnormal enhancement. Posterior Fossa, vertebral arteries, paraspinal tissues: Negative. Disc levels: Small central disc protrusions at the T3-4, T4-5, T5-6, T6-7, T7-8 levels. Protrusions at T5-6, T6-7, and T7-8 contact the anterior cord with mild cord flattening, this is most pronounced at T7-8. No significant foraminal narrowing or canal stenosis. IMPRESSION: 1. No acute  osseous abnormality or abnormal enhancement of the cervical and thoracic spine. 2. No abnormal cord signal or enhancement. 3. Mild cervical spondylosis predominantly at the C5-6 and C6-7 levels were disc and facet degenerative changes result in mild foraminal narrowing. No significant cervical canal stenosis. 4. Multiple small thoracic spine central disc protrusions. Protrusions at T5-6, T6-7, and T7-8 contact the anterior cord with mild cord flattening, this is most pronounced at T7-8. 5. No significant thoracic spine foraminal or canal stenosis. Electronically Signed   By: LKristine GarbeM.D.   On: 01/22/2017 20:43   Mr Thoracic Spine W Wo Contrast  Result Date: 01/22/2017 CLINICAL DATA:  76y/o F; chronic back and neck pain with 1 week of right arm numbness and tingling also occasionally in the left arm. EXAM: MRI CERVICAL SPINE WITHOUT AND WITH CONTRAST MRI THORACIC SPINE WITHOUT AND WITH CONTRAST TECHNIQUE: Multiplanar and multiecho pulse sequences of the cervical spine and thoracic spine, to include the craniocervical junction thoracolumbar, and cervicothoracic junction, were obtained without and with intravenous contrast. CONTRAST:  116mMULTIHANCE GADOBENATE DIMEGLUMINE 529 MG/ML IV SOLN COMPARISON:  None. FINDINGS: Cervical spine MRI findings: Alignment: Straightening of cervical lordosis with slight reversal at the C5-6 level. No listhesis. Vertebrae: No fracture, evidence of discitis, or bone lesion. No abnormal enhancement. Cord: No abnormal cord signal appear no abnormal enhancement. Posterior Fossa, vertebral arteries, paraspinal tissues: Negative. Disc levels: C2-3: No significant disc displacement, foraminal narrowing, or canal stenosis. C3-4: No significant disc displacement, foraminal narrowing, or canal stenosis. C4-5: No significant disc displacement, foraminal narrowing, or canal stenosis. Mild facet hypertrophy. C5-6: Small disc osteophyte complex with bilateral uncovertebral and  facet hypertrophy. Mild canal  stenosis and left-sided foraminal narrowing. C6-7: Small disc osteophyte complex and bilateral uncovertebral/ facet hypertrophy. Mild bilateral foraminal narrowing and canal stenosis. C7-T1: No significant disc displacement, foraminal narrowing, or canal stenosis. Thoracic spine MRI findings: Alignment: Physiologic. Vertebrae: No fracture, evidence of discitis, or bone lesion. No abnormal enhancement. Cord: Normal signal and morphology.  No abnormal enhancement. Posterior Fossa, vertebral arteries, paraspinal tissues: Negative. Disc levels: Small central disc protrusions at the T3-4, T4-5, T5-6, T6-7, T7-8 levels. Protrusions at T5-6, T6-7, and T7-8 contact the anterior cord with mild cord flattening, this is most pronounced at T7-8. No significant foraminal narrowing or canal stenosis. IMPRESSION: 1. No acute osseous abnormality or abnormal enhancement of the cervical and thoracic spine. 2. No abnormal cord signal or enhancement. 3. Mild cervical spondylosis predominantly at the C5-6 and C6-7 levels were disc and facet degenerative changes result in mild foraminal narrowing. No significant cervical canal stenosis. 4. Multiple small thoracic spine central disc protrusions. Protrusions at T5-6, T6-7, and T7-8 contact the anterior cord with mild cord flattening, this is most pronounced at T7-8. 5. No significant thoracic spine foraminal or canal stenosis. Electronically Signed   By: Kristine Garbe M.D.   On: 01/22/2017 20:43   Ct Bone Marrow Biopsy & Aspiration  Result Date: 01/26/2017 INDICATION: 76 year old female with leukocytosis EXAM: CT BONE MARROW BIOPSY AND ASPIRATION MEDICATIONS: None. ANESTHESIA/SEDATION: Moderate (conscious) sedation was employed during this procedure. A total of Versed 1.5 mg and Fentanyl 0 mcg was administered intravenously. Moderate Sedation Time: 0 minutes. FLUOROSCOPY TIME:  CT COMPLICATIONS: None PROCEDURE: The procedure risks, benefits, and  alternatives were explained to the patient. Questions regarding the procedure were encouraged and answered. The patient understands and consents to the procedure. Scout CT of the pelvis was performed for surgical planning purposes. The posterior pelvis was prepped with Betadinein a sterile fashion, and a sterile drape was applied covering the operative field. A sterile gown and sterile gloves were used for the procedure. Local anesthesia was provided with 1% Lidocaine. We targeted the right posterior iliac bone for biopsy. The skin and subcutaneous tissues were infiltrated with 1% lidocaine without epinephrine. A small stab incision was made with an 11 blade scalpel, and an 11 gauge Murphy needle was advanced with CT guidance to the posterior cortex. Manual forced was used to advance the needle through the posterior cortex and the stylet was removed. A bone marrow aspirate was retrieved and passed to a cytotechnologist in the room. The Murphy needle was then advanced without the stylet for a core biopsy. The core biopsy was retrieved and also passed to a cytotechnologist. Manual pressure was used for hemostasis and a sterile dressing was placed. No complications were encountered no significant blood loss was encountered. Patient tolerated the procedure well and remained hemodynamically stable throughout. IMPRESSION: Status post CT-guided bone marrow biopsy, with tissue specimen sent to pathology for complete histopathologic analysis Signed, Dulcy Fanny. Earleen Newport, DO [facility] Radiology Electronically Signed   By: Corrie Mckusick D.O.   On: 01/26/2017 12:17   Ct Angio Chest/abd/pel For Dissection W And/or Wo Contrast  Result Date: 01/22/2017 CLINICAL DATA:  Midsternal chest pain radiating into both upper extremities and to the back. Nausea. Recent hospital discharge after full cardiac workup for pain. EXAM: CT ANGIOGRAPHY CHEST, ABDOMEN AND PELVIS TECHNIQUE: Multidetector  CT imaging through the chest, abdomen and pelvis was performed using the standard protocol during bolus administration of intravenous contrast. Multiplanar reconstructed images and MIPs were obtained and reviewed  to evaluate the vascular anatomy. CONTRAST:  100 mL Isovue 370 intravenous COMPARISON:  Radiographs 01/22/2017 FINDINGS: CTA CHEST FINDINGS Cardiovascular: Preferential opacification of the thoracic aorta. The aorta is normal in caliber with moderate atherosclerotic plaque but no aneurysm or dissection. Proximal great vessels are patent. Pulmonary arterial vasculature is also well opacified with no evidence of embolism. Moderate coronary artery calcified plaque. Mediastinum/Nodes: No enlarged mediastinal, hilar, or axillary lymph nodes. Thyroid gland, trachea, and esophagus demonstrate no significant findings. Lungs/Pleura: Lungs are clear. No pleural effusion or pneumothorax. Musculoskeletal: No chest wall abnormality. No acute or significant osseous findings. Review of the MIP images confirms the above findings. CTA ABDOMEN AND PELVIS FINDINGS VASCULAR Aorta: Normal caliber abdominal aorta with moderate atherosclerotic plaque. No dissection or significant stenosis. Celiac: Patent without evidence of aneurysm, dissection, vasculitis or significant stenosis. SMA: Patent without evidence of aneurysm, dissection, vasculitis or significant stenosis. Renals: Accessory renal artery on the left. The renal arteries are patent without evidence of aneurysm, dissection, vasculitis, fibromuscular dysplasia or significant stenosis. IMA: Patent without evidence of aneurysm, dissection, vasculitis or significant stenosis. Inflow: Patent without evidence of aneurysm, dissection, vasculitis or significant stenosis.Generous atherosclerotic calcification. Veins: No obvious venous abnormality within the limitations of this arterial phase study. Review of the MIP images confirms the above findings. NON-VASCULAR Hepatobiliary:  No focal liver abnormality is seen. Status post cholecystectomy. No biliary dilatation. Pancreas: Unremarkable. No pancreatic ductal dilatation or surrounding inflammatory changes. Spleen: Normal in size without focal abnormality. Adrenals/Urinary Tract: Adrenal glands are unremarkable. Kidneys are normal, without renal calculi, focal lesion, or hydronephrosis. Bladder is unremarkable. Stomach/Bowel: Hiatal hernia. Stomach and small bowel appear otherwise unremarkable. Colon is unremarkable. Lymphatic: No pathologic adenopathy in the abdomen or pelvis. Reproductive: Status post hysterectomy. No adnexal masses. Other: No acute findings.  No ascites. Musculoskeletal: No significant skeletal lesion. Review of the MIP images confirms the above findings. IMPRESSION: 1. Normal caliber aorta with moderate atherosclerotic plaque. No evidence of dissection, aneurysm or significant stenosis. 2. No acute findings are evident in the chest, abdomen or pelvis. 3. Hiatal hernia. Electronically Signed   By: Andreas Newport M.D.   On: 01/22/2017 03:56     Discharge Exam: Vitals:   01/27/17 0717 01/27/17 1223  BP: (!) 92/42 (!) 107/49  Pulse: 76 79  Resp:  18  Temp: 99.5 F (37.5 C) 98.4 F (36.9 C)   Vitals:   01/26/17 2025 01/27/17 0500 01/27/17 0717 01/27/17 1223  BP: (!) 93/36 (!) 105/50 (!) 92/42 (!) 107/49  Pulse: 81 74 76 79  Resp: _0 Temp: 98.6 F (37 C) 98.4 F (36.9 C) 99.5 F (37.5 C) 98.4 F (36.9 C)  TempSrc: Oral Oral Oral Oral  SpO2: 97% 99% 98% 100%  Weight:  82.5 kg (181 lb 12.8 oz)    Height:        General: Pt is alert, follows commands appropriately, not in acute distress Cardiovascular: Regular rate and rhythm, S1/S2 +, no rubs, no gallops Respiratory: Clear to auscultation bilaterally, no wheezing, no crackles, no rhonchi Abdominal: Soft, non tender, non distended, bowel sounds +, no guarding  Discharge Instructions  Discharge Instructions    Diet - low sodium  heart healthy    Complete by:  As directed    Increase activity slowly    Complete by:  As directed      Allergies as of 01/27/2017      Reactions   Dilaudid [hydromorphone Hcl] Anaphylaxis   Penicillins Swelling  Sulfa Antibiotics Swelling   Tizanidine Hcl Anxiety      Medication List    STOP taking these medications   lisinopril 10 MG tablet Commonly known as:  PRINIVIL,ZESTRIL     TAKE these medications   albuterol 108 (90 Base) MCG/ACT inhaler Commonly known as:  PROVENTIL HFA;VENTOLIN HFA Inhale 1 puff into the lungs every 6 (six) hours as needed for wheezing or shortness of breath.   allopurinol 100 MG tablet Commonly known as:  ZYLOPRIM Take 100 mg by mouth 2 (two) times daily.   colchicine 0.6 MG tablet Take 1 tablet (0.6 mg total) by mouth daily.   HYDROcodone-acetaminophen 5-325 MG tablet Commonly known as:  NORCO/VICODIN Take 1 tablet by mouth every 6 (six) hours as needed for moderate pain.   levothyroxine 88 MCG tablet Commonly known as:  SYNTHROID, LEVOTHROID Take 88 mcg by mouth daily before breakfast.   traMADol 50 MG tablet Commonly known as:  ULTRAM Take 1 tablet (50 mg total) by mouth every 6 (six) hours as needed.      Follow-up Information    Lanier Clam, MD Follow up.   Specialty:  Family Medicine Contact information: [address] [place] [address] [phone]        Faye Ramsay, MD Follow up.   Specialty:  Internal Medicine Why:  call my cell [phone] with any questions or concerns  Contact information: [address] [place] [address] [phone]            The results of significant diagnostics from this hospitalization (including imaging, microbiology, ancillary and laboratory) are listed below for reference.     Microbiology: Recent Results (from the past 240 hour(s))  Blood culture (routine x 2)     Status: None   Collection Time: 01/22/17  4:58 AM  Result  Value Ref Range Status   Specimen Description BLOOD RIGHT HAND  Final   Special Requests BOTTLES DRAWN AEROBIC AND ANAEROBIC 5ML  Final   Culture NO GROWTH 5 DAYS  Final   Report Status 01/27/2017 FINAL  Final  Blood culture (routine x 2)     Status: None   Collection Time: 01/22/17  5:00 AM  Result Value Ref Range Status   Specimen Description BLOOD LEFT ARM  Final   Special Requests BOTTLES DRAWN AEROBIC AND ANAEROBIC 5ML  Final   Culture NO GROWTH 5 DAYS  Final   Report Status 01/27/2017 FINAL  Final     Labs: Basic Metabolic Panel:  Recent Labs Lab 01/23/17 0530 01/24/17 0354 01/25/17 0435 01/26/17 0351 01/27/17 0516  NA 136 136 135 139 141  K 4.0 4.2 4.0 3.5 3.3*  CL 102 100* 100* 103 104  CO2 _0 GLUCOSE 141* 149* 141* 126* 127*  BUN 12 14 26* 20 23*  CREATININE 0.90 0.98 1.37* 1.07* 1.10*  CALCIUM 8.2* 8.2* 8.0* 8.2* 8.6*   Liver Function Tests:  Recent Labs Lab 01/23/17 0530  AST 18  ALT 13*  ALKPHOS 51  BILITOT 0.7  PROT 5.8*  ALBUMIN 2.8*    Recent Labs Lab 01/22/17 0453  LIPASE 21   CBC:  Recent Labs Lab 01/22/17 0050 01/23/17 0530 01/24/17 0354 01/25/17 0435 01/26/17 0351 01/27/17 0516  WBC 27.3* 29.3* 33.6* 42.7* 43.2* 39.5*  NEUTROABS 1.1*  --   --   --  0.9*  --   HGB 10.5* 9.3* 9.3* 8.6* 8.5* 8.2*  HCT 31.3* 28.3* 28.0*  27.4* 26.4* 25.7*  24.9*  MCV 94.6 96.3 97.2 97.4 97.7 97.3  PLT 79* 68* 59* 54* 45* 44*   Cardiac Enzymes:  Recent Labs Lab 01/22/17 0535 01/22/17 1023 01/22/17 1558  TROPONINI 0.19* 0.20* 0.18*    SIGNED: Time coordinating discharge:  30 minutes  MAGICK-Hayven Croy, MD  [facility] 01/27/2017, 4:53 PM Pager [phone]  If 7PM-7AM, please contact night-coverage [email or web address] Password [email or web address]

## 2017-01-27 NOTE — Progress Notes (Signed)
Physical Therapy Treatment Patient Details Name: Sharon Pineda MRN: [id number] DOB: [birth year] Today's Date: 01/27/2017    History of Present Illness Pt is 76 y.o. female with known CAD, type 2 mobitz with pacer, HTN, thryoid CA s/p thyroidectomy, OSA on CPAP, who presented with 1 week duration of intermittent mid area chest pain that is occasionally but not consistently radiating to her right shoulder, 7/10 in severity when present and associated with upper area back pain. No specific alleviating factors but occasionally worse with movement. Pt went to [facility] 4 days prior to this admission (PTA) for this, was admitted, underwent LHC that showed an affected diagonal artery only, had a echo that was normal, had carotids that were normal, and was discharged one day PTA without a clear diagnosis. Cardiology was consulted here at [facility] for assistance as troponins remains mildly elevated.    PT Comments    Pt able to ambulate and perform standing exercises at sink with intermittent UE support with no c/o of increased pain. Pt needed mild v/c during standing exercises to maintain proper form. Plan to progress LE strengthening exercises and ambulate for endurance if pt allows. Review back precautions to assist with minimizing pain and provide HEP at next session.   Follow Up Recommendations  Outpatient PT     Equipment Recommendations  None recommended by PT    Recommendations for Other Services       Precautions / Restrictions Precautions Precautions: Fall Restrictions Weight Bearing Restrictions: No    Mobility  Bed Mobility Overal bed mobility: Independent                Transfers Overall transfer level: Independent Equipment used: None Transfers: Sit to/from Stand Sit to Stand: Independent         General transfer comment: no pain during transfer; good mechanics  Ambulation/Gait Ambulation/Gait assistance: Min guard;Independent Ambulation Distance (Feet): 160  Feet     Gait velocity: normal   General Gait Details: Pt required min guard in beginning for safety, to make pt aware of drifting L/R and reciprocal arm swing but progressed to independent by end of treatment.   Stairs            Wheelchair Mobility    Modified Rankin (Stroke Patients Only)       Balance Overall balance assessment: Independent   Sitting balance-Leahy Scale: Good       Standing balance-Leahy Scale: Good Standing balance comment: pt able to stand at sink during dynamic functional tasks with intermittent UE support                    Cognition Arousal/Alertness: Awake/alert Behavior During Therapy: WFL for tasks assessed/performed Overall Cognitive Status: Within Functional Limits for tasks assessed                      Exercises Total Joint Exercises Standing Hip Extension: Both;10 reps;Strengthening;Standing General Exercises - Lower Extremity Hip ABduction/ADduction: Strengthening;Both;10 reps;Standing Hip Flexion/Marching: Strengthening;Standing;10 reps;Both Mini-Sqauts: Strengthening;10 reps;Standing    General Comments        Pertinent Vitals/Pain Pain Assessment: No/denies pain    Home Living                      Prior Function            PT Goals (current goals can now be found in the care plan section) Acute Rehab PT Goals Patient Stated Goal: none stated Potential to Achieve Goals:  Good Progress towards PT goals: Progressing toward goals    Frequency    Min 3X/week      PT Plan Current plan remains appropriate    Co-evaluation             End of Session Equipment Utilized During Treatment: Gait belt Activity Tolerance: Patient tolerated treatment well Patient left: in bed;with family/visitor present     Time: 1359-1414 PT Time Calculation (min) (ACUTE ONLY): 15 min  Charges:  $Gait Training: 8-22 mins                    G Codes:      [facility] 01-30-2017, 2:34 PM   Olena [place], [place] Pager [phone]

## 2017-01-27 NOTE — Progress Notes (Signed)
Dr. Doyle Askew has requested that I contact family to inform them of a meeting that is planned to start at 27 today with the oncologist. I have attempted to call the following family, and left messages requesting that they return my call:  [place] (son) [phone] Leandra Kern (daughter) [phone]

## 2017-01-27 NOTE — Progress Notes (Addendum)
PROGRESS NOTE  Sharon Pineda  MKJ:[id number] DOB: December 19, 1941 DOA: 01/22/2017  PCP: Lanier Clam, MD   HPI: Pt is 76 y.o. female with known CAD, type 2 mobitz with pacer, HTN, thryoid CA s/p thyroidectomy, OSA on CPAP, who presented with 1 week duration of intermittent mid area chest pain that is occasionally but not consistently radiating to her right shoulder, 7/10 in severity when present and associated with upper area back pain. No specific alleviating factors but occasionally worse with movement. Pt went to [facility] 4 days prior to this admission (PTA) for this, was admitted, underwent LHC that showed an affected diagonal artery only, had a echo that was normal, had carotids that were normal, and was discharged one day PTA without a clear diagnosis. Cardiology was consulted here at [facility] for assistance as troponins remains mildly elevated. Telemetry bed requested.  Assessment and Plan: Back pain and chest pain:  - MRI C and T spine with and without contrast ordered but with no specific etiology that would explain this  - cardiology consulted for assistance but doubt more can be offered at this time as pt already had cardiac cath  - provide analgesia as needed  - motrin started, seems like it is helping but Cr is still slightly up so will need to monitor closely   Anemia, thrombocytopenia and leukocytosis - path smear worrisome for CLL - pt underwent bone marrow biopsy 2/5, d/w Dr. 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 Alvy Bimler would like to meet with family and pt around 4 pm today to discuss further plan, will try to arrange  - blood counts in all three cell lines stable in the past 24 hours  - CBC in AM  Acute kidney injury - unclear etiology, encouraged PO intake  - Cr is trending down - BMP in AM  Hypothyroidism - Continue levothyroxine  Hypertension, essential  - hold Lisinopril for now until renal function improves   Chronic  pain - Continue hydrocodone  DVT prophylaxis: SCD's Code Status: Full  Family Communication: Patient and family at bedside  Disposition Plan: to be determined, will go home but further recommendations pending Dr. Alvy Bimler discussion with pt and family   Consultants:  Cardiology  Oncology   Procedures:   Bone marrow biopsy 2/5 -->  Antimicrobials:   None  Subjective: No concerns this AM.   Objective: Vitals:   01/26/17 1503 01/26/17 2025 01/27/17 0500 01/27/17 0717  BP: (!) 115/41 (!) 93/36 (!) 105/50 (!) 92/42  Pulse: 80 81 74 76  Resp:  18 18   Temp:  98.6 F (37 C) 98.4 F (36.9 C) 99.5 F (37.5 C)  TempSrc:  Oral Oral Oral  SpO2:  97% 99% 98%  Weight:   82.5 kg (181 lb 12.8 oz)   Height:        Intake/Output Summary (Last 24 hours) at 01/27/17 0958 Last data filed at 01/27/17 6773  Gross per 24 hour  Intake              480 ml  Output                0 ml  Net              480 ml   Filed Weights   01/25/17 0444 01/26/17 0711 01/27/17 0500  Weight: 83 kg (183 lb) 81.7 kg (180 lb 1.6 oz) 82.5 kg (181 lb 12.8 oz)    Examination:  General exam:  Appears calm and comfortable  Respiratory system: Respiratory effort normal. Cardiovascular system: S1 & S2 heard, RRR. No JVD, rubs, gallops or clicks. No pedal edema. Gastrointestinal system: Abdomen is nondistended, soft and nontender. No organomegaly or masses felt.  Central nervous system: Alert and oriented. No focal neurological deficits. Extremities: Symmetric 5 x 5 power.  Data Reviewed: I have personally reviewed following labs and imaging studies  CBC:  Recent Labs Lab 01/22/17 0050 01/23/17 0530 01/24/17 0354 01/25/17 0435 01/26/17 0351 01/27/17 0516  WBC 27.3* 29.3* 33.6* 42.7* 43.2* 39.5*  NEUTROABS 1.1*  --   --   --  0.9*  --   HGB 10.5* 9.3* 9.3* 8.6* 8.5* 8.2*  HCT 31.3* 28.3* 28.0*  27.4* 26.4* 25.7* 24.9*  MCV 94.6 96.3 97.2 97.4 97.7 97.3  PLT 79* 68* 59* 54* 45* 44*   Basic  Metabolic Panel:  Recent Labs Lab 01/23/17 0530 01/24/17 0354 01/25/17 0435 01/26/17 0351 01/27/17 0516  NA 136 136 135 139 141  K 4.0 4.2 4.0 3.5 3.3*  CL 102 100* 100* 103 104  CO2 26 28 27 27 24   GLUCOSE 141* 149* 141* 126* 127*  BUN 12 14 26* 20 23*  CREATININE 0.90 0.98 1.37* 1.07* 1.10*  CALCIUM 8.2* 8.2* 8.0* 8.2* 8.6*   Liver Function Tests:  Recent Labs Lab 01/23/17 0530  AST 18  ALT 13*  ALKPHOS 51  BILITOT 0.7  PROT 5.8*  ALBUMIN 2.8*    Recent Labs Lab 01/22/17 0453  LIPASE 21   Cardiac Enzymes:  Recent Labs Lab 01/22/17 0535 01/22/17 1023 01/22/17 1558  TROPONINI 0.19* 0.20* 0.18*   Urine analysis:    Component Value Date/Time   COLORURINE YELLOW 01/22/2017 0309   APPEARANCEUR TURBID (A) 01/22/2017 0309   LABSPEC 1.030 01/22/2017 0309   PHURINE 5.5 01/22/2017 0309   GLUCOSEU NEGATIVE 01/22/2017 0309   HGBUR LARGE (A) 01/22/2017 0309   BILIRUBINUR NEGATIVE 01/22/2017 0309   KETONESUR NEGATIVE 01/22/2017 0309   PROTEINUR 100 (A) 01/22/2017 0309   NITRITE NEGATIVE 01/22/2017 0309   LEUKOCYTESUR NEGATIVE 01/22/2017 0309   Recent Results (from the past 240 hour(s))  Blood culture (routine x 2)     Status: None (Preliminary result)   Collection Time: 01/22/17  4:58 AM  Result Value Ref Range Status   Specimen Description BLOOD RIGHT HAND  Final   Special Requests BOTTLES DRAWN AEROBIC AND ANAEROBIC 5ML  Final   Culture NO GROWTH 4 DAYS  Final   Report Status PENDING  Incomplete  Blood culture (routine x 2)     Status: None (Preliminary result)   Collection Time: 01/22/17  5:00 AM  Result Value Ref Range Status   Specimen Description BLOOD LEFT ARM  Final   Special Requests BOTTLES DRAWN AEROBIC AND ANAEROBIC 5ML  Final   Culture NO GROWTH 4 DAYS  Final   Report Status PENDING  Incomplete    Radiology Studies: Ct Bone Marrow Biopsy & Aspiration  Result Date: 01/26/2017 INDICATION: 76 year old female with leukocytosis EXAM: CT BONE  MARROW BIOPSY AND ASPIRATION MEDICATIONS: None. ANESTHESIA/SEDATION: Moderate (conscious) sedation was employed during this procedure. A total of Versed 1.5 mg and Fentanyl 0 mcg was administered intravenously. Moderate Sedation Time: 0 minutes. FLUOROSCOPY TIME:  CT COMPLICATIONS: None PROCEDURE: The procedure risks, benefits, and alternatives were explained to the patient. Questions regarding the procedure were encouraged and answered. The patient understands and consents to the procedure. Scout CT of the pelvis was performed for surgical planning purposes. The  posterior pelvis was prepped with Betadinein a sterile fashion, and a sterile drape was applied covering the operative field. A sterile gown and sterile gloves were used for the procedure. Local anesthesia was provided with 1% Lidocaine. We targeted the right posterior iliac bone for biopsy. The skin and subcutaneous tissues were infiltrated with 1% lidocaine without epinephrine. A small stab incision was made with an 11 blade scalpel, and an 11 gauge Murphy needle was advanced with CT guidance to the posterior cortex. Manual forced was used to advance the needle through the posterior cortex and the stylet was removed. A bone marrow aspirate was retrieved and passed to a cytotechnologist in the room. The Murphy needle was then advanced without the stylet for a core biopsy. The core biopsy was retrieved and also passed to a cytotechnologist. Manual pressure was used for hemostasis and a sterile dressing was placed. No complications were encountered no significant blood loss was encountered. Patient tolerated the procedure well and remained hemodynamically stable throughout. IMPRESSION: Status post CT-guided bone marrow biopsy, with tissue specimen sent to pathology for complete histopathologic analysis Signed, Dulcy Fanny. Earleen Newport, DO [facility] Radiology Electronically Signed   By: Corrie Mckusick D.O.   On:  01/26/2017 12:17   Scheduled Meds: . allopurinol  100 mg Oral BID  . colchicine  0.6 mg Oral BID  . ibuprofen  800 mg Oral TID  . levothyroxine  88 mcg Oral QAC breakfast  . senna-docusate  1 tablet Oral BID   Continuous Infusions:   LOS: 3 days   Time spent: 20 minutes   Sharon Ramsay, MD [facility] Pager [phone]  If 7PM-7AM, please contact night-coverage [email or web address] Password [email or web address] 01/27/2017, 9:58 AM

## 2017-01-27 NOTE — Progress Notes (Signed)
Occupational Therapy Treatment and Discharge Patient Details Name: Sharon Pineda MRN: [id number] DOB: 18-Apr-1941 Today's Date: 01/27/2017    History of present illness Pt is 76 y.o. female with known CAD, type 2 mobitz with pacer, HTN, thryoid CA s/p thyroidectomy, OSA on CPAP, who presented with 1 week duration of intermittent mid area chest pain that is occasionally but not consistently radiating to her right shoulder, 7/10 in severity when present and associated with upper area back pain. No specific alleviating factors but occasionally worse with movement. Pt went to [facility] 4 days prior to this admission (PTA) for this, was admitted, underwent LHC that showed an affected diagonal artery only, had a echo that was normal, had carotids that were normal, and was discharged one day PTA without a clear diagnosis. Cardiology was consulted here at [facility] for assistance as troponins remains mildly elevated.   OT comments  Pt is performing ADL independently to modified independently. No further OT needs, all goals met.  Follow Up Recommendations  No OT follow up    Equipment Recommendations  None recommended by OT    Recommendations for Other Services      Precautions / Restrictions Precautions Precautions: Fall Restrictions Weight Bearing Restrictions: No       Mobility Bed Mobility Overal bed mobility: Independent                Transfers Overall transfer level: Independent Equipment used: None Transfers: Sit to/from Stand Sit to Stand: Independent         General transfer comment: no pain during transfer; good mechanics    Balance Overall balance assessment: Independent   Sitting balance-Leahy Scale: Good       Standing balance-Leahy Scale: Good Standing balance comment: reports having washed her hair in the sink yesterday                   ADL Overall ADL's : Independent                         Toilet Transfer:  Independent;Ambulation       Tub/ Shower Transfer: Ambulation;Modified independent;Walk-in shower   Functional mobility during ADLs: Independent (within room) General ADL Comments: Pt has been routinely performing ADL and toileting independently.      Vision                     Perception     Praxis      Cognition   Behavior During Therapy: WFL for tasks assessed/performed Overall Cognitive Status: Within Functional Limits for tasks assessed                       Extremity/Trunk Assessment               Exercises    Shoulder Instructions       General Comments      Pertinent Vitals/ Pain       Pain Assessment: No/denies pain  Home Living                                          Prior Functioning/Environment              Frequency           Progress Toward Goals  OT Goals(current goals can now be found in the  care plan section)  Progress towards OT goals: Goals met/education completed, patient discharged from OT  Acute Rehab OT Goals Patient Stated Goal: none stated  Plan Discharge plan remains appropriate    Co-evaluation                 End of Session Equipment Utilized During Treatment: Gait belt   Activity Tolerance Patient tolerated treatment well   Patient Left in bed;with family/visitor present   Nurse Communication          Time: 0034-9611 OT Time Calculation (min): 12 min  Charges: OT General Charges $OT Visit: 1 Procedure OT Treatments $Self Care/Home Management : 8-22 mins  [name] 01/27/2017, 3:29 PM  [phone]

## 2017-01-27 NOTE — Progress Notes (Signed)
Pt resting in bed, denied pain at this time. 

## 2017-02-02 LAB — CHROMOSOME ANALYSIS, BONE MARROW

## 2017-02-09 ENCOUNTER — Encounter (HOSPITAL_COMMUNITY): Payer: Self-pay

## 2017-02-13 LAB — PATHOLOGIST SMEAR REVIEW

## 2017-02-19 DEATH — deceased

## 2017-02-25 ENCOUNTER — Encounter: Payer: Medicare PPO | Admitting: *Deleted

## 2017-02-25 ENCOUNTER — Telehealth: Payer: Self-pay | Admitting: Cardiology

## 2017-02-25 NOTE — Telephone Encounter (Signed)
Attempted to confirm remote transmission with pt. No answer and was unable to leave a message. Number is disconnected.

## 2017-02-27 ENCOUNTER — Encounter: Payer: Self-pay | Admitting: Cardiology

## 2017-02-27 NOTE — Progress Notes (Signed)
Letter  

## 2017-05-22 ENCOUNTER — Encounter: Payer: Medicare PPO | Admitting: Internal Medicine

## 2017-06-12 ENCOUNTER — Encounter: Payer: Medicare PPO | Admitting: Internal Medicine

## 2017-06-12 ENCOUNTER — Encounter: Payer: Self-pay | Admitting: Internal Medicine

## 2017-11-04 IMAGING — CT CT BIOPSY AND ASPIRATION BONE MARROW
1 of 2 series · 15 of 32 positions shown, 19 images · non-contrast
Comparison: none

INDICATION: 75-year-old female with leukocytosis

[Series 2: i-spiral 5.0 b40f · axial · 0.87mm/px · z∈[+1024,+1122]mm · 15 of 32 slices shown, 19 images]
[im 2/32  soft-tissue]
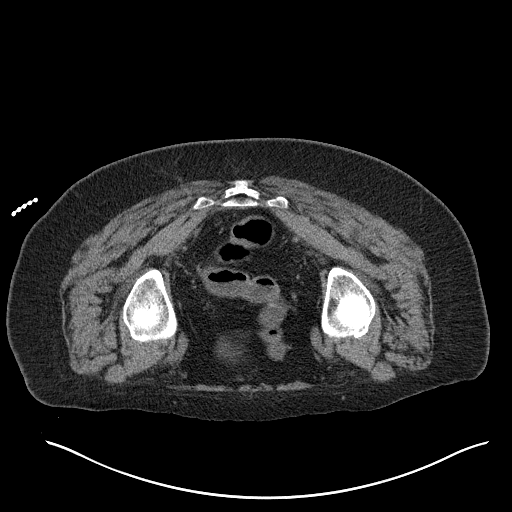
[im 2/32  bone]
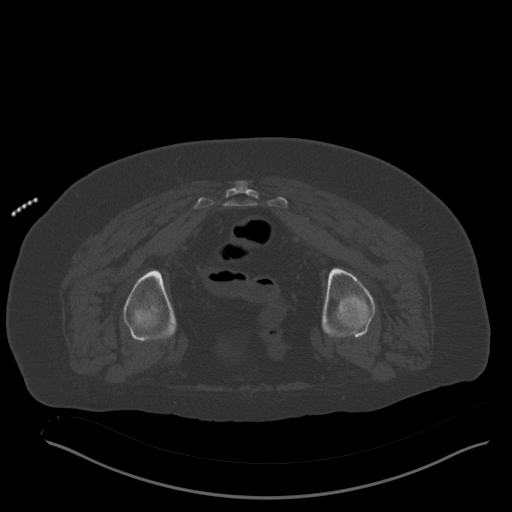
[im 4/32  soft-tissue]
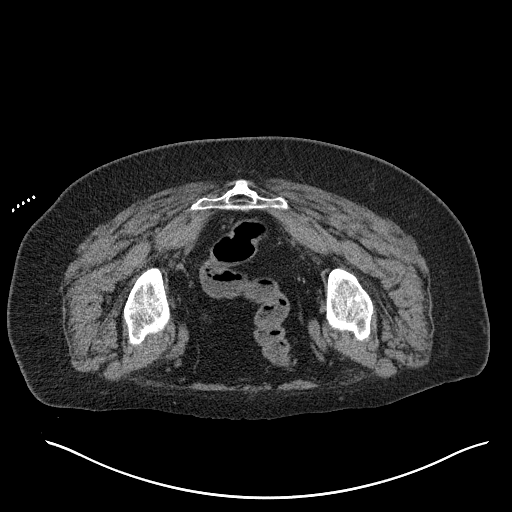
[im 6/32  soft-tissue]
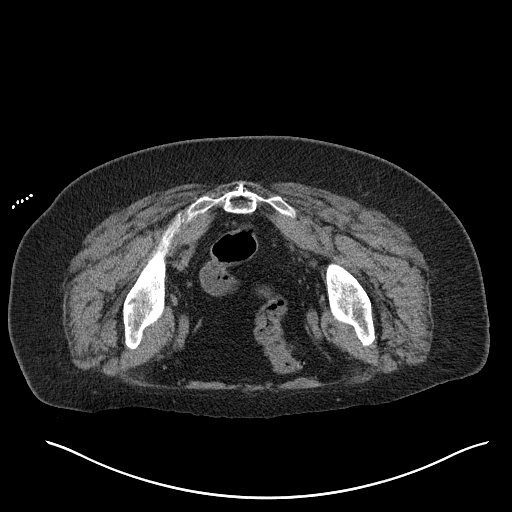
[im 9/32  soft-tissue]
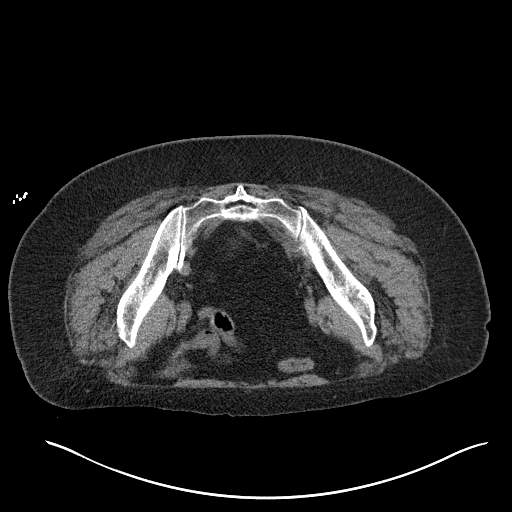
[im 11/32  soft-tissue]
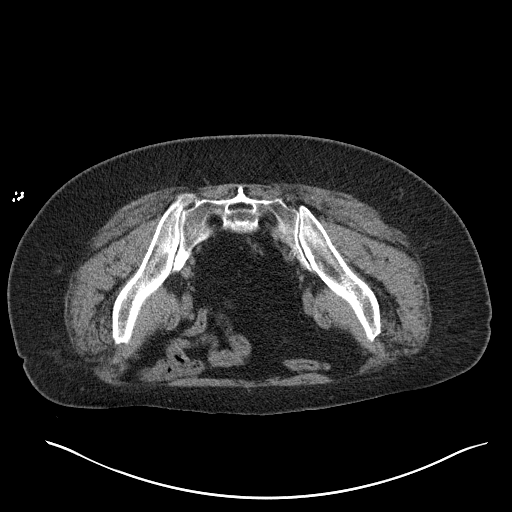
[im 14/32  soft-tissue]
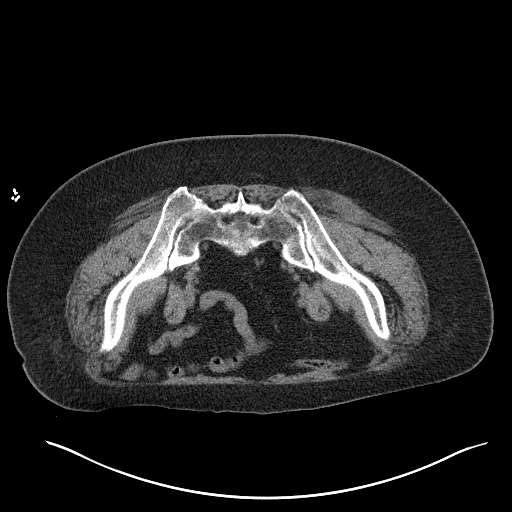
[im 16/32  soft-tissue]
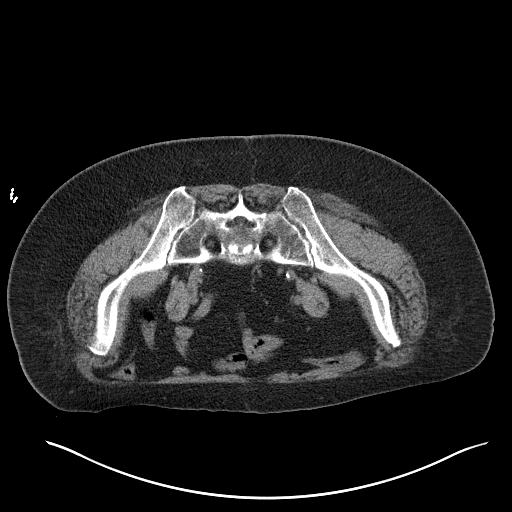
[im 18/32  soft-tissue]
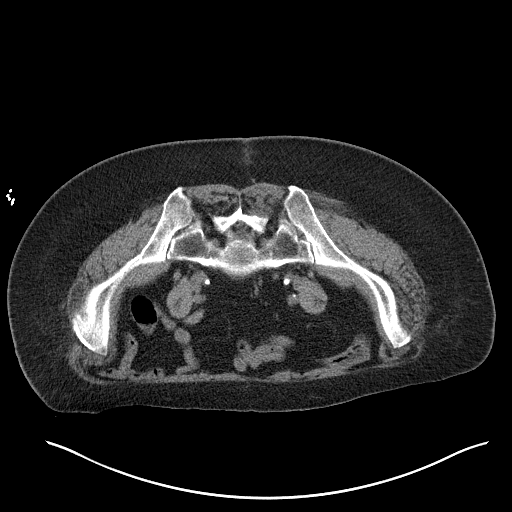
[im 21/32  soft-tissue]
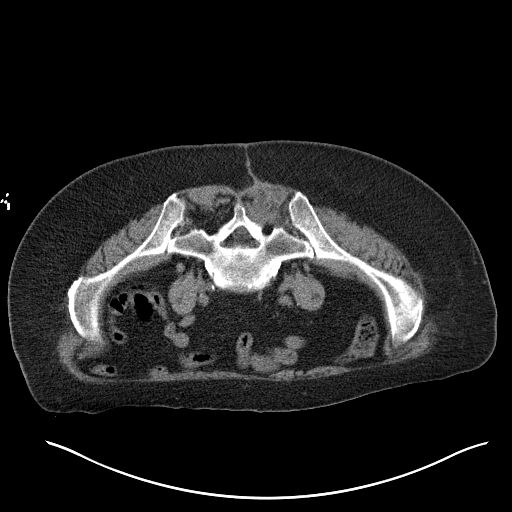
[im 21/32  bone]
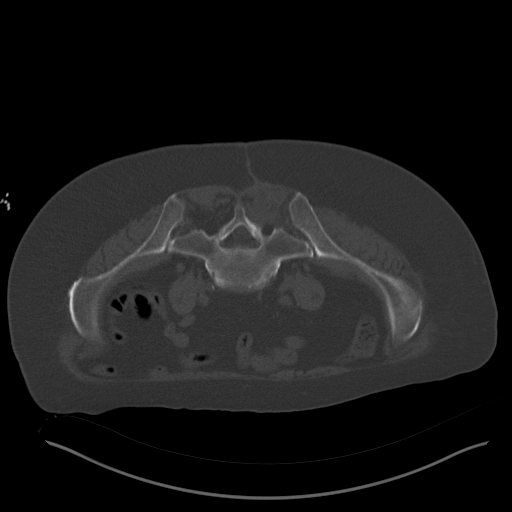
[im 23/32  soft-tissue]
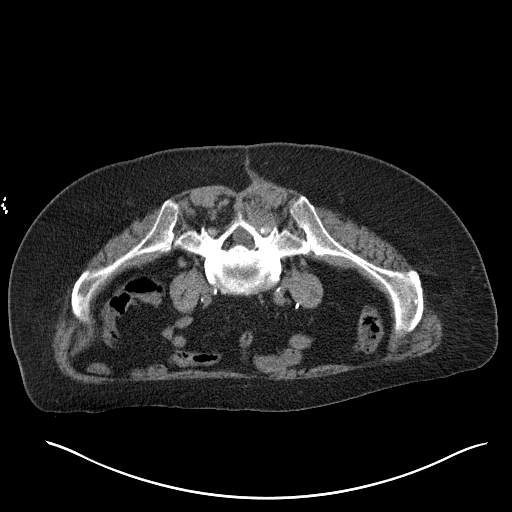
[im 26/32  soft-tissue]
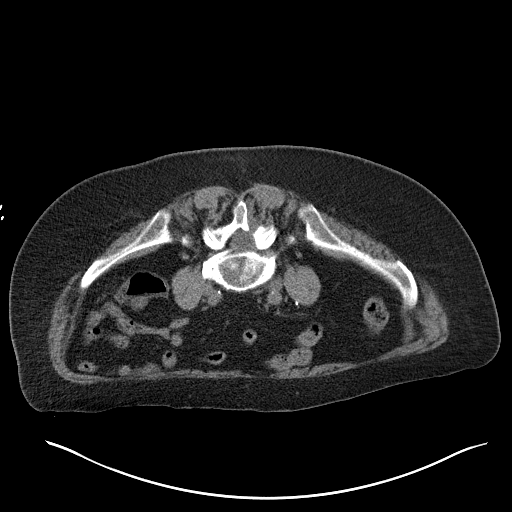
[im 27/32  lung]
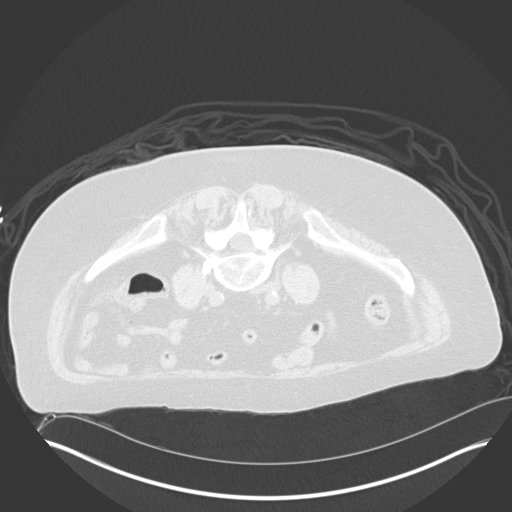
[im 28/32  soft-tissue]
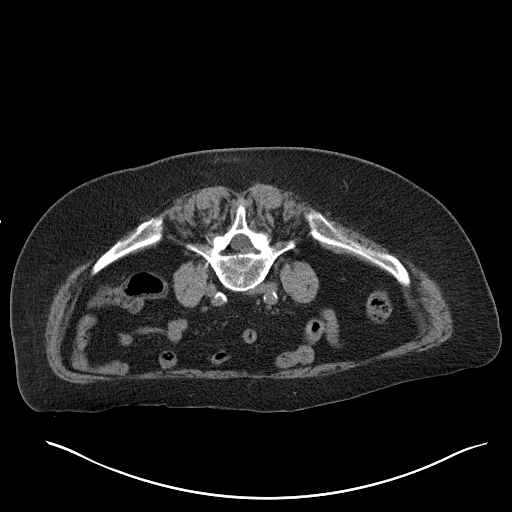
[im 28/32  lung]
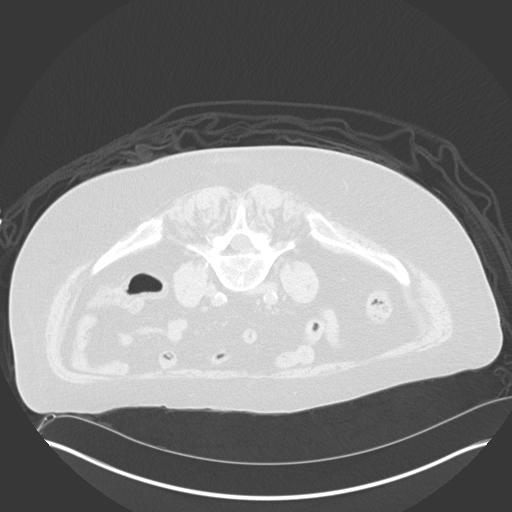
[im 29/32  lung]
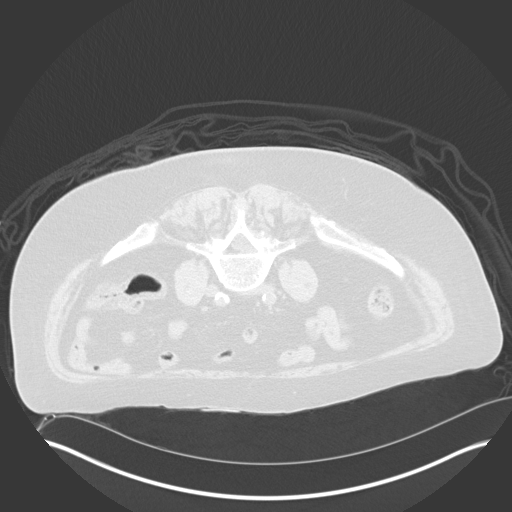
[im 30/32  soft-tissue]
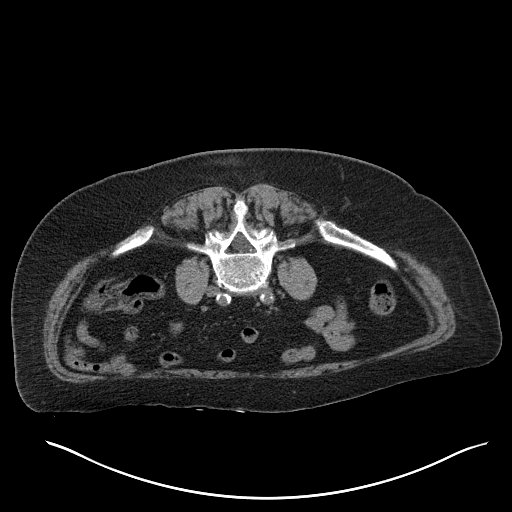
[im 30/32  lung]
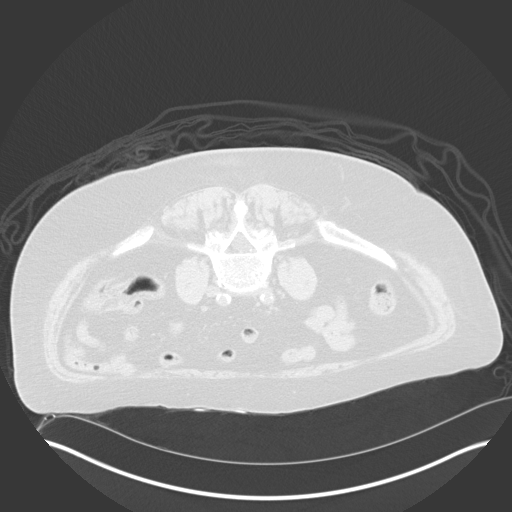

[15 of 32 positions shown; findings below may reference images not displayed]

EXAM:
CT BONE MARROW BIOPSY AND ASPIRATION

MEDICATIONS:
None.

ANESTHESIA/SEDATION:
Moderate (conscious) sedation was employed during this procedure. A
total of Versed 1.5 mg and Fentanyl 0 mcg was administered
intravenously.

Moderate Sedation Time: 0 minutes.

FLUOROSCOPY TIME:  CT

COMPLICATIONS:
None

PROCEDURE:
The procedure risks, benefits, and alternatives were explained to
the patient. Questions regarding the procedure were encouraged and
answered. The patient understands and consents to the procedure.

Scout CT of the pelvis was performed for surgical planning purposes.

The posterior pelvis was prepped with Betadinein a sterile fashion,
and a sterile drape was applied covering the operative field. A
sterile gown and sterile gloves were used for the procedure. Local
anesthesia was provided with 1% Lidocaine.

We targeted the right posterior iliac bone for biopsy. The skin and
subcutaneous tissues were infiltrated with 1% lidocaine without
epinephrine. A small stab incision was made with an 11 blade
scalpel, and an 11 gauge Rini needle was advanced with CT guidance
to the posterior cortex. Manual forced was used to advance the
needle through the posterior cortex and the stylet was removed. A
bone marrow aspirate was retrieved and passed to a cytotechnologist
in the room. The Rini needle was then advanced without the stylet
for a core biopsy. The core biopsy was retrieved and also passed to
a cytotechnologist.

Manual pressure was used for hemostasis and a sterile dressing was
placed.

No complications were encountered no significant blood loss was
encountered.

Patient tolerated the procedure well and remained hemodynamically
stable throughout.
IMPRESSION: Status post CT-guided bone marrow biopsy, with tissue specimen sent
to pathology for complete histopathologic analysis
